# Patient Record
Sex: Female | Born: 1975
Health system: Southern US, Community
[De-identification: ages and names within clinical notes are randomized; demographics above are authoritative.]

## PROBLEM LIST (undated history)

## (undated) DIAGNOSIS — E039 Hypothyroidism, unspecified: Secondary | ICD-10-CM

## (undated) DIAGNOSIS — E119 Type 2 diabetes mellitus without complications: Secondary | ICD-10-CM

## (undated) DIAGNOSIS — D649 Anemia, unspecified: Secondary | ICD-10-CM

## (undated) HISTORY — DX: Anemia, unspecified: D64.9

## (undated) HISTORY — PX: TUBAL LIGATION: SHX77

## (undated) HISTORY — DX: Hypothyroidism, unspecified: E03.9

---

## 2007-12-07 ENCOUNTER — Ambulatory Visit (HOSPITAL_COMMUNITY): Admission: RE | Admit: 2007-12-07 | Discharge: 2007-12-07 | Payer: Self-pay | Admitting: Obstetrics & Gynecology

## 2008-04-16 ENCOUNTER — Inpatient Hospital Stay (HOSPITAL_COMMUNITY): Admission: AD | Admit: 2008-04-16 | Discharge: 2008-04-19 | Payer: Self-pay | Admitting: Obstetrics & Gynecology

## 2008-04-16 ENCOUNTER — Ambulatory Visit: Payer: Self-pay | Admitting: Obstetrics and Gynecology

## 2009-01-12 DIAGNOSIS — E039 Hypothyroidism, unspecified: Secondary | ICD-10-CM

## 2009-01-12 HISTORY — DX: Hypothyroidism, unspecified: E03.9

## 2010-04-23 LAB — CBC
Hemoglobin: 9.4 g/dL — ABNORMAL LOW (ref 12.0–15.0)
MCHC: 33.7 g/dL (ref 30.0–36.0)
MCHC: 33.8 g/dL (ref 30.0–36.0)
MCHC: 34 g/dL (ref 30.0–36.0)
MCV: 92.7 fL (ref 78.0–100.0)
RBC: 2.94 MIL/uL — ABNORMAL LOW (ref 3.87–5.11)
RDW: 16.1 % — ABNORMAL HIGH (ref 11.5–15.5)
WBC: 14 10*3/uL — ABNORMAL HIGH (ref 4.0–10.5)
WBC: 14.8 10*3/uL — ABNORMAL HIGH (ref 4.0–10.5)

## 2010-04-23 LAB — RPR: RPR Ser Ql: NONREACTIVE

## 2010-05-27 NOTE — Op Note (Signed)
NAME:  Amy Maldonado, Amy Maldonado NO.:  [id number]   MEDICAL RECORD NO.:  [id number]          PATIENT TYPE:  INP   LOCATION:  9126                          [facility]ACILITY:  [facility]   PHYSICIAN:  Allie Bossier, MD        DATE OF BIRTH:  06/27/1975   DATE OF PROCEDURE:  DATE OF DISCHARGE:                               OPERATIVE REPORT   PREOPERATIVE DIAGNOSES:  1. Thirty eight weeks' labor.  2. Previous cesarean section x2.  3. Multiparity, desires sterility.   POSTOPERATIVE DIAGNOSES:  1. Thirty eight weeks' labor.  2. Previous cesarean section x2.  3. Multiparity, desires sterility.   PROCEDURE:  Repeat low-transverse cesarean section.   SURGEON:  Allie Bossier, MD   ANESTHESIA:  Spinal, Pamalee Leyden, MD   COMPLICATIONS:  None.   ESTIMATED BLOOD LOSS:  600 mL.   SPECIMENS:  Cord blood.   FINDINGS:  1. Living female infant with Apgars of 8 and 9 at one and five minutes,      weight 7 pounds 7 ounces.  2. Normal ovaries and oviducts bilaterally.  3. Intact placenta with three-vessel cord.   DETAILED PROCEDURE AND FINDINGS:  Risks, benefits, and alternatives of  the surgery were explained, understood, and accepted, consents were  signed.  She desires a tubal ligation and understands the permanence of  the procedure as well as 0.5% failure rate.  She wishes to proceed.  She  was taken to the operating room and spinal anesthesia was applied  without complication.  She was placed in the dorsal supine position with  a left lateral tilt.  Her abdomen and vagina were prepped and draped in  the usual sterile fashion.  A Foley catheter was placed, which drained  clear urine throughout the case.  Adequate anesthesia was assured.  A  transverse incision was made approximately 2 cm above the symphysis  pubis.  Please note that the patient's previous skin incisions were  vertical and she and I discussed the option of a repeat vertical versus  a primary low-transverse skin incision.  She opted  for the low-  transverse skin incision.  Incision was carried down through the  subcutaneous tissue of the fascia.  Fascia was scored in midline,  fascial incision was extended bilaterally, and curved slightly upwards.  The rectus muscles were partially separated in transverse fashion using  electrosurgical technique.  Excellent hemostasis was maintained.  The  peritoneum was entered with hemostats.  Peritoneal incision was extended  with the Bovie bilaterally and curved slightly upwards taking care to  avoid the bladder.  A bladder blade was placed.  A transverse incision  was made on the moderately well-developed lower uterine segment.  The  uterine incision was extended with bandage scissors bilaterally and  curved slightly upwards.  Amniotomy was performed with hemostat and  clear fluid was noted.  Baby was delivered from vertex presentation with  the assistance of a kiwi vacuum extractor.  Please note the pressure was  in the green zone and only one probe was required to yield an atraumatic  delivery.  The baby's mouth  and nostrils were suctioned prior to  delivery of the shoulders.  Cord was clamped and cut and the baby was  transferred to the pediatrician for routine care.  Weight and Apgars are  listed above.  Cord blood sample was obtained.  The placenta was  extracted manually and noted to be intact.  Please note that the uterus  was left in situ.  The uterine interior was cleaned with a dry lap  sponge.  A bladder blade was placed.  The uterine incision was closed  with one layer of 2-0 Vicryl running locking suture.  Excellent  hemostasis was noted.  I then proceeded to tilt the uterus to each side,  I was able to visualize the ovary and the fimbria of the oviduct in the  isthmic region of each oviduct.  A Filshie clip was placed across the  entire oviduct in a perpendicular fashion.  No bleeding was noted at  either side.  I then re-inspected the uterine incision and no  bleeding  was noted.  The fascia and rectus muscles were hemostatic as well.  The  edges of the peritoneum were grasped with Kelly clamps, and the  peritoneal closure was done with a 2-0 Vicryl running nonlocking suture.  The fascia was closed with a 0-Vicryl running nonlocking suture.  No  defects were palpable.  The subcutaneous tissue was irrigated, cleaned,  and dried.  It was then infiltrated with 30 mL of 0.5% Marcaine.  A  subcuticular closure was done with 3-0 Vicryl suture.  Steri-Strips were  placed across the incision.  Foley catheter drained clear urine  throughout.  She was taken to recovery room in stable condition.  She  tolerated the procedure well.      Allie Bossier, MD  Electronically Signed     MCD/MEDQ  D:  04/16/2008  T:  04/17/2008  Job:  [id number]

## 2010-05-30 NOTE — Discharge Summary (Signed)
NAME:  Amy Maldonado, CZERWONKA NO.:  [id number]   MEDICAL RECORD NO.:  [id number]          PATIENT TYPE:  INP   LOCATION:  9126                          [facility]  [facility]   PHYSICIAN:  Norton Blizzard, MD    DATE OF BIRTH:  10-Feb-1975   DATE OF ADMISSION:  04/16/2008  DATE OF DISCHARGE:  04/19/2008                               DISCHARGE SUMMARY   BRIEF HOSPITAL COURSE:  This is a 35 year old, G3, P3-0-0-3, at 38.4  weeks that presented in labor but desired an elective repeat cesarean  section.  She underwent an uncomplicated repeat low transverse cesarean  section  and bilateral tubal ligation under spinal anesthesia.  Her  estimated blood loss was 600 mL.  She delivered a viable female infant  with Apgar scores of 8 and 9 at 1 at 5 minutes respectively.  The  patient is breastfeeding.  Prenatal labs: blood type was O positive,  rubella immune, RPR nonreactive, HIV nonreactive, hepatitis B surface  antigen negative, gonorrhea and chlamydia negative,  quad screen was  negative, 1-hour Glucola 142.  The patient was instructed to have pelvic  rest for 6 weeks.  She [facility]  [facility].  Her baby did receive a circumcision while here in the hospital.  The baby was discharged with mother, and they were both in stable  condition. Discharge medications are ibuprofen and prenatal vitamins.      Jamie Brookes, MD      Norton Blizzard, MD  Electronically Signed    AS/MEDQ  D:  05/10/2008  T:  05/11/2008  Job:  [id number]

## 2012-05-10 ENCOUNTER — Telehealth (HOSPITAL_COMMUNITY): Payer: Self-pay

## 2012-05-10 ENCOUNTER — Emergency Department (HOSPITAL_COMMUNITY)
Admission: EM | Admit: 2012-05-10 | Discharge: 2012-05-10 | Disposition: A | Discharge status: Home Health | Payer: No Typology Code available for payment source | Source: Home / Self Care

## 2012-05-10 ENCOUNTER — Encounter (HOSPITAL_COMMUNITY): Payer: Self-pay

## 2012-05-10 DIAGNOSIS — O99810 Abnormal glucose complicating pregnancy: Secondary | ICD-10-CM

## 2012-05-10 DIAGNOSIS — D649 Anemia, unspecified: Secondary | ICD-10-CM

## 2012-05-10 DIAGNOSIS — O24419 Gestational diabetes mellitus in pregnancy, unspecified control: Secondary | ICD-10-CM

## 2012-05-10 LAB — GLUCOSE, CAPILLARY: Glucose-Capillary: 80 mg/dL (ref 70–99)

## 2012-05-10 LAB — HEMOGLOBIN A1C
Hgb A1c MFr Bld: 5.3 % (ref <5.7)
Mean Plasma Glucose: 105 mg/dL (ref <117)

## 2012-05-10 LAB — FERRITIN: Ferritin: 4 ng/mL — ABNORMAL LOW (ref 10–291)

## 2012-05-10 LAB — IRON AND TIBC
Iron: 11 ug/dL — ABNORMAL LOW (ref 42–135)
Saturation Ratios: 2 % — ABNORMAL LOW (ref 20–55)
UIBC: 440 ug/dL — ABNORMAL HIGH (ref 125–400)

## 2012-05-10 LAB — VITAMIN B12: Vitamin B-12: 951 pg/mL — ABNORMAL HIGH (ref 211–911)

## 2012-05-10 NOTE — ED Notes (Signed)
Patient has a history of pr- DM Anemis

## 2012-05-10 NOTE — ED Notes (Signed)
Spoke with beth from [facility] lab  Lavender top tube had clotted Need to repeat cbc/rit Tried to call patient- number not working

## 2012-05-10 NOTE — ED Provider Notes (Signed)
History     CSN: [id number]  Arrival date & time 05/10/12  1950  37 year old female who presented to primary care today because of anemia. The history is limited because the patient does not speak [place] although a language interpreter was used. The patient is primarily concerned about gestational diabetes and she developed during her last pregnancy. Apparently she has been monitoring her CBGs and her last sugar this morning was 114. She denies any family history of diabetes. She denies any vaginal bleeding. She denies any hematochezia melena.    Chief Complaint  Patient presents with  . Anemia    (Consider location/radiation/quality/duration/timing/severity/associated sxs/prior treatment) HPI  History reviewed. No pertinent past medical history.  History reviewed. No pertinent past surgical history.  No family history on file.  History  Substance Use Topics  . Smoking status: Not on file  . Smokeless tobacco: Not on file  . Alcohol Use: Not on file    OB History   Grav Para Term Preterm Abortions TAB SAB Ect Mult Living                  Review of Systems Anemia Allergies  Review of patient's allergies indicates no known allergies.  Home Medications  No current outpatient prescriptions on file.  BP 114/66  Pulse 67  Temp(Src) 99 F (37.2 C) (Oral)  Resp 17  SpO2 100%  Physical Exam Neck: Neck supple. No JVD present.  Cardiovascular: Normal rate. Exam reveals no gallop and no friction rub.  No murmur heard.  Pulmonary/Chest: Effort normal and breath sounds normal. She has no decreased breath sounds.  Lymphadenopathy:  She has no cervical adenopathy.  Neurological: She is alert and oriented to person, place, and time. She exhibits normal muscle tone. Coordination normal.  Skin: No rash noted. No erythema.   ED Course  Procedures (including critical care time)  Labs Reviewed - No data to display No results found.   No diagnosis found.    MDM   Anemia Will redraw anemia panel, CBC,  History of gestational diabetes next number check a hemoglobin A1c        Richarda Overlie, MD 05/10/12 1119

## 2012-05-24 ENCOUNTER — Ambulatory Visit: Payer: No Typology Code available for payment source | Attending: Family Medicine | Admitting: Internal Medicine

## 2012-05-24 ENCOUNTER — Encounter: Payer: Self-pay | Admitting: Internal Medicine

## 2012-05-24 VITALS — BP 103/69 | HR 70 | Temp 98.6°F | Resp 17 | Wt 128.0 lb

## 2012-05-24 DIAGNOSIS — E039 Hypothyroidism, unspecified: Secondary | ICD-10-CM | POA: Insufficient documentation

## 2012-05-24 DIAGNOSIS — D649 Anemia, unspecified: Secondary | ICD-10-CM | POA: Insufficient documentation

## 2012-05-24 DIAGNOSIS — D509 Iron deficiency anemia, unspecified: Secondary | ICD-10-CM | POA: Insufficient documentation

## 2012-05-24 MED ORDER — FERROUS SULFATE 325 (65 FE) MG PO TABS: 325.0000 mg | ORAL_TABLET | Freq: Two times a day (BID) | ORAL | Status: DC

## 2012-05-24 MED ORDER — LEVOTHYROXINE SODIUM 25 MCG PO TABS: 25.0000 ug | ORAL_TABLET | Freq: Every day | ORAL | Status: DC

## 2012-05-24 NOTE — Patient Instructions (Addendum)
Dficit de hierro Anemia (Iron Deficiency Anemia) Existen diferentes tipos de anemia. La anemia por dficit de hierro es la ms comn. La anemia por deficiencia de hierro es una disminucin en la cantidad de glbulos rojos producida por una baja cantidad de hierro. Sin la suficiente cantidad de hierro, su cuerpo no puede producir suficiente hemoglobina. La hemoglobina es una sustancia presente en los glbulos rojos que lleva oxgeno a los tejidos del cuerpo. La anemia por deficiencia de hierro puede producirle cansancio y falta de aire. CAUSAS  Falta de hierro en la dieta.  Esto puede observarse en bebs y nios, debido a la poca cantidad de Dance movement psychotherapist.  Puede observarse en adultos que no consumen alimentos ricos en hierro.  Puede observarse en mujeres embarazadas o que amamantan, que no toman suplementos de hierro. Hay una mayor necesidad de consumo de hierro en estos momentos.  Pobre absorcin de hierro, como ocurre [employer] trastornos intestinales, como la remocin [place] del intestino delgado o en un bypass intestinal.  Sangrado intestinal.  Perodos menstruales profusos. SNTOMAS Cuando la anemia es leve puede pasar inadvertida. Los sntomas pueden ser:  Alma Friendly.  Dolor de [place].  Palidez.  Debilidad.  Falta de aire.  Mareos.  Manos y pies fros.  Latidos cardacos irregulares o acelerados. DIAGNSTICO El diagnstico requiere una evaluacin rigurosa y un examen fsico por parte del profesional que lo asiste.  Los exmenes sanguneos se utilizan generalmente para confirmar una anemia por dficit de hierro.  Podrn realizarse pruebas adicionales para encontrar la causa subyacente de su anemia. Pueden incluir:  El anlisis de sangre en la materia fecal (examen de sangre oculta en heces).  Un procedimiento para observar dentro del colon y el recto (colonoscopa).  Un procedimiento para observar dentro del esfago y Investment banker, corporate  (endoscopa). TRATAMIENTO  Corregir la causa del dficit de hierro es Secretary/administrator.  Ciertos medicamentos, como los anticonceptivos orales, pueden hacer que el flujo menstrual sea ms leve.  Pueden utilizarse antibiticos y otros medicamentos para tratar lceras ppticas.  Puede ser Lois Huxley ciruga para remover un plipo que sangra, un tumor o un fibroide.  A menudo, se deben tomar suplementos de hierro (sulfato ferroso).  Para una mejor absorcin del hierro, tome estos suplementos con el estmago vaco.  Es posible que deba tomar los suplementos con alimentos si no puede tolerarlos con el estmago vaco. La vitamina C mejora la absorcin de hierro. El profesional que lo asiste podr recomendarle que tome sus tabletas de hierro con un vaso de jugo de naranja o suplemento de vitamina C.  No debe tomar [employer] o anticidos al mismo tiempo que los suplementos de hierro. Esto podra interferir con la absorcin de hierro.  Los suplementos de hierro pueden causar constipacin. A menudo se recomienda un ablandador de heces.  Las mujeres embarazadas o que amamantan debern tomar suplementos de hierro, porque su dieta normal a menudo no provee la cantidad necesaria.  Los pacientes que no pueden Engineer, technical sales hierro [employer] lo pueden recibir por Paramedic, o por medio de una inyeccin en el msculo. INSTRUCCIONES PARA EL CUIDADO DOMICILIARIO  El dietista o el profesional que lo asiste podrn ayudarla con las dudas relacionadas con la dieta.  Tome hierro y vitaminas segn se lo ha Designer, television/film set que lo asiste.  Lleve una dieta rica en hierro. Consuma hgado, carne magra, pan integral, huevos, frutos secos, y vegetales de [employer]. SOLICITE ATENCIN MDICA DE INMEDIATO SI:  Tiene un episodio  de desmayo. No conduzca.Llame (911 en los [place]) o al servicio de emergencias local si no dispone de otro tipo de [place].  Siente dolor en el pecho, nuseas (ganas de  vomitar) o vmitos.  Presenta una dificultad respiratoria grave al Arts development officer.  Se siente dbil o con mucha sed.  Tiene pulso y ritmo cardaco acelerados.  Presenta una sudoracin imprevista o se siente mareado al levantarse de la cama o de la silla. ASEGRESE QUE:  Comprende estas instrucciones.  Controlar su enfermedad.  Solicitar ayuda inmediatamente si no mejora o si empeora. Document Released: 12/29/2004 Document Revised: 03/23/2011 [facility] Patient Information 2013 [place], [place].

## 2012-05-24 NOTE — Progress Notes (Signed)
Patient is a hospital follow up Diagnosis- anemia

## 2012-05-24 NOTE — Progress Notes (Signed)
Patient ID: Amy Maldonado, female   DOB: 1975-09-06, 37 y.o.   MRN: [id number]   CC: follow up on recent blood work   HPI: Pt is 37 yo female with history of anemia and hypothyroidism, who presents to clinic for follow up on recent blood work, wants to know if she is diabetic. She denies chest pain or shortness of breath, no abdominal or urinary concerns, no recent sicknesses or hospitalizations.  No Known Allergies  MEDICATIONS: ferrous sulfate 325 (65 FE) MG tablet, Take 1 tablet (325 mg total) by mouth 2 (two) times daily with breakfast and lunch levothyroxine (SYNTHROID, LEVOTHROID) 25 MCG tablet, Take 1 tablet (25 mcg total) by mouth daily before breakfast  Past Medical History  Diagnosis Date  . Anemia   . Hypothyroidism 2011    Family History  Problem Relation Age of Onset  . Hypertension Mother   . Diabetes Father   . Hypertension Father     History   Social History  . Marital Status: Married    Spouse Name: N/A    Number of Children: N/A  . Years of Education: N/A   Occupational History  . Not on file.   Social History Main Topics  . Smoking status: Never Smoker   . Smokeless tobacco: Not on file  . Alcohol Use: No  . Drug Use: No  . Sexually Active: Yes   Other Topics Concern  . Not on file   Social History Narrative  . No narrative on file    Review of Systems  Constitutional: Negative for fever, chills, diaphoresis, activity change, appetite change and fatigue.  HENT: Negative for ear pain, nosebleeds, congestion, facial swelling, rhinorrhea, neck pain, neck stiffness and ear discharge.   Eyes: Negative for pain, discharge, redness, itching and visual disturbance.  Respiratory: Negative for cough, choking, chest tightness, shortness of breath, wheezing and stridor.   Cardiovascular: Negative for chest pain, palpitations and leg swelling.  Gastrointestinal: Negative for abdominal distention.  Genitourinary: Negative for dysuria, urgency,  frequency, hematuria, flank pain, decreased urine volume, difficulty urinating and dyspareunia.  Musculoskeletal: Negative for back pain, joint swelling, arthralgias and gait problem.  Neurological: Negative for dizziness, tremors, seizures, syncope, facial asymmetry, speech difficulty, weakness, light-headedness, numbness and headaches.  Hematological: Negative for adenopathy. Does not bruise/bleed easily.  Psychiatric/Behavioral: Negative for hallucinations, behavioral problems, confusion, dysphoric mood, decreased concentration and agitation.    Objective:   Filed Vitals:   05/24/12 1222  BP: 103/69  Pulse: 70  Temp: 98.6 F (37 C)  Resp: 17    Physical Exam  Constitutional: Appears well-developed and well-nourished. No distress.  HENT: Normocephalic. External right and left ear normal. Oropharynx is clear and moist.  Eyes: Conjunctivae and EOM are normal. PERRLA, no scleral icterus.  Neck: Normal ROM. Neck supple. No JVD. No tracheal deviation. No thyromegaly.  CVS: RRR, S1/S2 +, no murmurs, no gallops, no carotid bruit.  Pulmonary: Effort and breath sounds normal, no stridor, rhonchi, wheezes, rales.  Abdominal: Soft. BS +,  no distension, tenderness, rebound or guarding.  Musculoskeletal: Normal range of motion. No edema and no tenderness.  Lymphadenopathy: No lymphadenopathy noted, cervical, inguinal. Neuro: Alert. Normal reflexes, muscle tone coordination. No cranial nerve deficit. Skin: Skin is warm and dry. No rash noted. Not diaphoretic. No erythema. No pallor.  Psychiatric: Normal mood and affect. Behavior, judgment, thought content normal.   Lab Results  Component Value Date   WBC 14.8* 04/18/2008   HGB 9.4* 04/18/2008   HCT 27.8* 04/18/2008  MCV 94.6 04/18/2008   PLT 155 04/18/2008   No results found for this basename: CREATININE, BUN, NA, K, CL, CO2    Lab Results  Component Value Date   HGBA1C 5.3 05/10/2012    Assessment:   Patient Active Problem List    Diagnosis Date Noted  . Iron deficiency anemia 05/24/2012  . Hypothyroidism         Plan:   - continue taking iron supplementation BID, repeat CBC in 3 months - continue Synthroid 25 mcg QD, check TSH in 3 months - discussed regular follow ups and dietary recommendation provided as pt has family history of diabetes but per A1C she isn ot currently considered diabetic

## 2012-09-06 ENCOUNTER — Ambulatory Visit: Payer: No Typology Code available for payment source | Attending: Internal Medicine | Admitting: Internal Medicine

## 2012-09-06 ENCOUNTER — Other Ambulatory Visit (HOSPITAL_COMMUNITY)
Admission: RE | Admit: 2012-09-06 | Discharge: 2012-09-06 | Disposition: A | Discharge status: Home / Self Care | Payer: No Typology Code available for payment source | Source: Ambulatory Visit | Attending: Internal Medicine | Admitting: Internal Medicine

## 2012-09-06 VITALS — BP 108/73 | HR 92 | Temp 98.9°F | Resp 16 | Wt 118.0 lb

## 2012-09-06 DIAGNOSIS — N760 Acute vaginitis: Secondary | ICD-10-CM | POA: Insufficient documentation

## 2012-09-06 DIAGNOSIS — E039 Hypothyroidism, unspecified: Secondary | ICD-10-CM

## 2012-09-06 DIAGNOSIS — K644 Residual hemorrhoidal skin tags: Secondary | ICD-10-CM | POA: Insufficient documentation

## 2012-09-06 DIAGNOSIS — Z113 Encounter for screening for infections with a predominantly sexual mode of transmission: Secondary | ICD-10-CM | POA: Insufficient documentation

## 2012-09-06 DIAGNOSIS — D509 Iron deficiency anemia, unspecified: Secondary | ICD-10-CM

## 2012-09-06 DIAGNOSIS — Z01419 Encounter for gynecological examination (general) (routine) without abnormal findings: Secondary | ICD-10-CM | POA: Insufficient documentation

## 2012-09-06 LAB — CBC WITH DIFFERENTIAL/PLATELET
Basophils Absolute: 0 10*3/uL (ref 0.0–0.1)
Eosinophils Absolute: 0 10*3/uL (ref 0.0–0.7)
HCT: 37.3 % (ref 36.0–46.0)
MCV: 93.5 fL (ref 78.0–100.0)
Monocytes Relative: 6 % (ref 3–12)
Neutrophils Relative %: 77 % (ref 43–77)
Platelets: 343 10*3/uL (ref 150–400)
RBC: 3.99 MIL/uL (ref 3.87–5.11)
RDW: 14.5 % (ref 11.5–15.5)

## 2012-09-06 LAB — COMPLETE METABOLIC PANEL WITH GFR
ALT: 23 U/L (ref 0–35)
Alkaline Phosphatase: 53 U/L (ref 39–117)
BUN: 10 mg/dL (ref 6–23)
Calcium: 9.4 mg/dL (ref 8.4–10.5)
Chloride: 104 mEq/L (ref 96–112)
Glucose, Bld: 82 mg/dL (ref 70–99)
Potassium: 4 mEq/L (ref 3.5–5.3)

## 2012-09-06 LAB — LIPID PANEL
Cholesterol: 148 mg/dL (ref 0–200)
VLDL: 13 mg/dL (ref 0–40)

## 2012-09-06 MED ORDER — HYDROCORTISONE 2.5 % RE CREA: TOPICAL_CREAM | Freq: Two times a day (BID) | RECTAL | Status: DC

## 2012-09-06 NOTE — Patient Instructions (Signed)
Hemorroides  (Hemorrhoids) Las hemorroides son venas inflamadas alrededor del recto o del ano. Hay dos tipos de hemorroides:   Hemorroides internas. Aparecen en las venas del interior del recto. Pueden abultarse hacia el exterior e irritarse y Cabin crew.  Hemorroides externas. Se producen en las venas externas al ano y pueden sentirse como un bulto o zona hinchada dura y dolorosa cerca del ano. CAUSAS   Embarazo.   Obesidad.   Constipacin o diarrea.   Dificultad para mover el intestino.   Permanecer sentado durante largos perodos en el inodoro.  Levantar pesas u otras actividades que impliquen esfuerzo.  Sexo anal. SNTOMAS   Dolor.   Picazn o irritacin anal.   Sangrado rectal.   Prdida fecal.   Hinchazn anal.   Uno o ms bultos en la zona anal.  DIAGNSTICO  El mdico puede diagnosticar las hemorroides mediante un examen visual. Otros estudios o anlisis que se pueden realizar son:   Examen de la zona rectal con Neomia Dear mano enguantada (examen digital rectal).   Examen de canal anal usando un pequeo tubo (endoscopio).   Anlisis de sangre si ha perdido [place] cantidad significativa de [place].  Estudio para observar el interior del colon (sigmoidoscopa o colonoscopa). TRATAMIENTO  La mayora de las hemorroides pueden tratarse en casa. Sin embargo, si los sntomas no mejoran o tiene Runner, broadcasting/film/video, el mdico puede Education officer, environmental un procedimiento para disminuir las hemorroides o extirparlas completamente. Los tratamientos posibles son:   Colocacin de una banda de goma en la base de la hemorroide para cortar la circulacin (ligadura con Curator).   Inyeccin de una sustancia qumica para reducir la hemorroide (escleroterapia).   Utilizacin de un instrumento para quemar la hemorroide (terapia con luz infrarroja).   Extirpacin quirrgica de las hemorroides (hemorroidectoma).   Colocacin de grapas en la hemorroide para bloquear el flujo de sangre  a los tejidos (engrapado de hemorroides).  INSTRUCCIONES PARA EL CUIDADO EN EL HOGAR   Consuma alimentos con fibra, como cereales integrales, legumbres, frutos secos, frutas y verduras. Pregntele a su mdico acerca de tomar productos con fibra aadida en ellos (suplementos defibra).  Aumente la ingesta de lquidos. Beba gran cantidad de lquido para mantener la orina de tono claro o color amarillo plido.   Haga ejercicios regularmente.   Vaya al bao cuando sienta la necesidad de mover el intestino. No espere.   Evite hacer fuerza al mover el intestino.   Mantenga la zona anal limpia y seca. Use papel higinico hmedo o toallitas humedecidas despus de mover el intestino.   Puede usar o Contractor segn las indicaciones algunas cremas especiales y supositorios.   Tome slo medicamentos de venta libre o recetados, segn las indicaciones del mdico.   Tome baos de asiento tibios durante 15 a 20 minutos, 3 a 4 veces por da para Primary school teacher y las [place].   Coloque una bolsa de hielo sobre las hemorroides si le duelen o se hinchan. Usar las compresas de [employer] baos de asiento puede ser [place].   Ponga el hielo en una bolsa plstica.   Colquese una toalla entre la piel y la bolsa de hielo.   Deje el hielo durante 15 a 20 minutos y aplquelo 3 a 4 veces por da.   No utilice una almohada en forma de aro ni se siente en el inodoro durante perodos prolongados. Esto aumenta la afluencia de sangre y Chief Technology Officer.  SOLICITE ATENCIN MDICA SI:   Aumenta el dolor y la hinchazn  y no puede controlarlo con la medicacin o con un tratamiento.  Tiene un sangrado que no puede parar.  No puede mover el intestino.  Siente dolor o tiene inflamacin fuera de la zona de las hemorroides. ASEGRESE DE QUE:   Comprende estas instrucciones.  Controlar su enfermedad.  Solicitar ayuda de inmediato si no mejora o si empeora. Document Released: 12/29/2004 Document  Revised: 12/16/2011 [facility] Patient Information 2014 [place], [place].

## 2012-09-06 NOTE — Progress Notes (Signed)
Patient here for physical and pap Has thyroid issues  Anemia Had some type of pimple removed from her rectum Has burning when she has bowel movement

## 2012-09-06 NOTE — Progress Notes (Signed)
Patient ID: Amy Maldonado, female   DOB: 03/17/1975, 37 y.o.   MRN: [id number]  CC: Followup  HPI: Patient is a 37 years old woman here today for followup visit. And also for Pap smear. He has history of external hemorrhoid status post electrocautery. She continues to experience pain during defecation, occasional blood in stool. Patient claims to engage in an anal sex with husband and wonders if this could be the result. Denies chest pain, no headache.  No Known Allergies Past Medical History  Diagnosis Date  . Anemia   . Hypothyroidism 2011   Current Outpatient Prescriptions on File Prior to Visit  Medication Sig Dispense Refill  . ferrous sulfate 325 (65 FE) MG tablet Take 1 tablet (325 mg total) by mouth 2 (two) times daily with breakfast and lunch.  60 tablet  3  . levothyroxine (SYNTHROID, LEVOTHROID) 25 MCG tablet Take 1 tablet (25 mcg total) by mouth daily before breakfast.  30 tablet  3   No current facility-administered medications on file prior to visit.   Family History  Problem Relation Age of Onset  . Hypertension Mother   . Diabetes Father   . Hypertension Father    History   Social History  . Marital Status: Married    Spouse Name: N/A    Number of Children: N/A  . Years of Education: N/A   Occupational History  . Not on file.   Social History Main Topics  . Smoking status: Never Smoker   . Smokeless tobacco: Not on file  . Alcohol Use: No  . Drug Use: No  . Sexual Activity: Yes   Other Topics Concern  . Not on file   Social History Narrative  . No narrative on file    Review of Systems: Constitutional: Negative for fever, chills, diaphoresis, activity change, appetite change and fatigue. HENT: Negative for ear pain, nosebleeds, congestion, facial swelling, rhinorrhea, neck pain, neck stiffness and ear discharge.  Eyes: Negative for pain, discharge, redness, itching and visual disturbance. Respiratory: Negative for cough, choking, chest tightness,  shortness of breath, wheezing and stridor.  Cardiovascular: Negative for chest pain, palpitations and leg swelling. Gastrointestinal: Negative for abdominal distention. Genitourinary: Negative for dysuria, urgency, frequency, hematuria, flank pain, decreased urine volume, difficulty urinating and dyspareunia.  Musculoskeletal: Negative for back pain, joint swelling, arthralgias and gait problem. Neurological: Negative for dizziness, tremors, seizures, syncope, facial asymmetry, speech difficulty, weakness, light-headedness, numbness and headaches.  Hematological: Negative for adenopathy. Does not bruise/bleed easily. Psychiatric/Behavioral: Negative for hallucinations, behavioral problems, confusion, dysphoric mood, decreased concentration and agitation.    Objective:   Filed Vitals:   09/06/12 1009  BP: 108/73  Pulse: 92  Temp: 98.9 F (37.2 C)  Resp: 16    Physical Exam: Constitutional: Patient appears well-developed and well-nourished. No distress. HENT: Normocephalic, atraumatic, External right and left ear normal. Oropharynx is clear and moist.  Eyes: Conjunctivae and EOM are normal. PERRLA, no scleral icterus. Neck: Normal ROM. Neck supple. No JVD. No tracheal deviation. No thyromegaly. CVS: RRR, S1/S2 +, no murmurs, no gallops, no carotid bruit.  Pulmonary: Effort and breath sounds normal, no stridor, rhonchi, wheezes, rales.  Abdominal: Soft. BS +,  no distension, tenderness, rebound or guarding.  Musculoskeletal: Normal range of motion. No edema and no tenderness.  Lymphadenopathy: No lymphadenopathy noted, cervical, inguinal or axillary Neuro: Alert. Normal reflexes, muscle tone coordination. No cranial nerve deficit. Skin: Skin is warm and dry. No rash noted. Not diaphoretic. No erythema. No pallor. Psychiatric: Normal  mood and affect. Behavior, judgment, thought content normal. Rectal exam: Circumferential anal tag, no active bleeding, no signs of inflammation Lab  Results  Component Value Date   WBC 14.8* 04/18/2008   HGB 9.4* 04/18/2008   HCT 27.8* 04/18/2008   MCV 94.6 04/18/2008   PLT 155 04/18/2008   No results found for this basename: CREATININE, BUN, NA, K, CL, CO2    Lab Results  Component Value Date   HGBA1C 5.3 05/10/2012   Lipid Panel  No results found for this basename: chol, trig, hdl, cholhdl, vldl, ldlcalc       Assessment and plan:   Patient Active Problem List   Diagnosis Date Noted  . External hemorrhoid 09/06/2012  . Iron deficiency anemia 05/24/2012  . Unspecified hypothyroidism 05/24/2012  . Hypothyroidism   Pelvic exam: Normal female external genitalia. Minimal whitish discharge. Cervix motion tenderness negative. Pap smear done  CBC D CMP Lipid panel Complete urinalysis Thyroid function test Hemoglobin A1c  Pap smear done, for cytology GC/Chlamydia and wet mount  Anusol cream for external hemorrhoid  Saida Lonon was given clear instructions to go to ER or return to the clinic if symptoms don't improve, worsen or new problems develop.  Marya Landry verbalized understanding.  Yeira Gulden was told to call to get lab results if hasn't heard anything in the next week.          Jeanann Lewandowsky, MD [facility] [place], [place] [phone]   09/06/2012, 11:04 AM

## 2012-09-07 LAB — URINALYSIS, COMPLETE
Glucose, UA: NEGATIVE mg/dL
Hgb urine dipstick: NEGATIVE
Urobilinogen, UA: 0.2 mg/dL (ref 0.0–1.0)
pH: 7 (ref 5.0–8.0)

## 2012-09-08 ENCOUNTER — Telehealth: Payer: Self-pay

## 2012-09-08 LAB — TSH+FREE T4
Free T4: 1.11 ng/dL (ref 0.82–1.77)
TSH: 3.56 u[IU]/mL (ref 0.450–4.500)

## 2012-09-08 NOTE — Telephone Encounter (Signed)
Done  Left a voice mail to patient about her lab results

## 2012-09-08 NOTE — Telephone Encounter (Signed)
Amy Maldonado can you call patient and let her  Know her pap and blood work was all normal Thank you

## 2012-09-09 ENCOUNTER — Telehealth: Payer: Self-pay | Admitting: Emergency Medicine

## 2012-09-09 DIAGNOSIS — A64 Unspecified sexually transmitted disease: Secondary | ICD-10-CM

## 2012-09-09 DIAGNOSIS — A499 Bacterial infection, unspecified: Secondary | ICD-10-CM

## 2012-09-09 MED ORDER — METRONIDAZOLE 500 MG PO TABS: ORAL_TABLET | ORAL | Status: DC

## 2012-09-09 NOTE — Telephone Encounter (Signed)
Rx Flagyl 500 mg to take BID x 5 dys ordered. Pt may pick up @ [facility].

## 2012-09-09 NOTE — Telephone Encounter (Signed)
Pt informed she can pick Rx Flagyl up from [facility] pharm on [address].

## 2012-09-09 NOTE — Telephone Encounter (Signed)
Pt aware of her results and she would like to take the medicine and send the prescription to [facility]  At [address] .

## 2012-09-09 NOTE — Telephone Encounter (Signed)
Message copied by Darlis Loan on Fri Sep 09, 2012  3:38 PM ------      Message from: Jeanann Lewandowsky E      Created: Thu Sep 08, 2012  9:10 AM       Please call patient to inform her that her Pap smear come back negative for malignancy      She has an infection that is not sexually transmitted but still need to be treated      If she wants we can call in prescription to her desired pharmacy she cannot come to the clinic to pick up a prescription ------

## 2012-09-14 ENCOUNTER — Ambulatory Visit: Payer: No Typology Code available for payment source | Attending: Internal Medicine

## 2012-09-14 ENCOUNTER — Ambulatory Visit: Payer: No Typology Code available for payment source

## 2012-09-19 ENCOUNTER — Other Ambulatory Visit: Payer: Self-pay | Admitting: Internal Medicine

## 2012-09-19 ENCOUNTER — Telehealth: Payer: Self-pay | Admitting: Internal Medicine

## 2012-09-19 MED ORDER — LEVOTHYROXINE SODIUM 25 MCG PO TABS: 25.0000 ug | ORAL_TABLET | Freq: Every day | ORAL | Status: DC

## 2012-09-19 NOTE — Telephone Encounter (Signed)
Pt here for refill on levothyroxine, pt received printed script

## 2013-03-21 ENCOUNTER — Ambulatory Visit: Payer: Self-pay | Attending: Internal Medicine

## 2013-09-19 ENCOUNTER — Telehealth: Payer: Self-pay | Admitting: Internal Medicine

## 2013-09-19 NOTE — Telephone Encounter (Signed)
Pt is requesting a refill for her thyroid: levothyroxine (SYNTHROID, LEVOTHROID) 25 MCG tablet. She only has 10 pills left. Please follow up with her. She is a Paediatric nurse.

## 2013-09-20 NOTE — Telephone Encounter (Signed)
Pt have not been seen in 1 year. Pt stated will make apt with PCP for Thyroid F/U and medicine refill.

## 2013-09-26 ENCOUNTER — Other Ambulatory Visit: Payer: Self-pay

## 2013-09-28 ENCOUNTER — Ambulatory Visit: Payer: Self-pay | Attending: Internal Medicine | Admitting: Internal Medicine

## 2013-09-28 ENCOUNTER — Encounter: Payer: Self-pay | Admitting: Internal Medicine

## 2013-09-28 VITALS — BP 115/75 | HR 81 | Temp 99.2°F | Resp 16 | Wt 120.0 lb

## 2013-09-28 DIAGNOSIS — D649 Anemia, unspecified: Secondary | ICD-10-CM | POA: Insufficient documentation

## 2013-09-28 DIAGNOSIS — E039 Hypothyroidism, unspecified: Secondary | ICD-10-CM | POA: Insufficient documentation

## 2013-09-28 LAB — POCT GLYCOSYLATED HEMOGLOBIN (HGB A1C): Hemoglobin A1C: 5.2

## 2013-09-28 MED ORDER — LEVOTHYROXINE SODIUM 25 MCG PO TABS: 25.0000 ug | ORAL_TABLET | Freq: Every day | ORAL | Status: DC

## 2013-09-28 MED ORDER — FERROUS SULFATE 325 (65 FE) MG PO TABS: 325.0000 mg | ORAL_TABLET | Freq: Two times a day (BID) | ORAL | Status: DC

## 2013-09-28 NOTE — Progress Notes (Signed)
Patient ID: Amy Maldonado, female   DOB: 02/16/1975, 38 y.o.   MRN: [id number]   Amy Maldonado, is a 38 y.o. female  [id number]  [id number]  DOB - [birth year]  Chief Complaint  Patient presents with  . Follow-up        Subjective:   Amy Maldonado is a 38 y.o. female here today for a follow up visit. Patient history significant for hypothyroidism and chronic anemia here today for routine follow-up. She has no new complaint. She is here for blood draw. Patient is on levothyroxine 25 g tablet by mouth daily and iron supplement. Patient has No headache, No chest pain, No abdominal pain - No Nausea, No new weakness tingling or numbness, No Cough - SOB.  No problems updated.  ALLERGIES: No Known Allergies  PAST MEDICAL HISTORY: Past Medical History  Diagnosis Date  . Anemia   . Hypothyroidism 2011    MEDICATIONS AT HOME: Prior to Admission medications   Medication Sig Start Date End Date Taking? Authorizing Provider  levothyroxine (SYNTHROID, LEVOTHROID) 25 MCG tablet Take 1 tablet (25 mcg total) by mouth daily before breakfast. 09/28/13  Yes Tresa Garter, MD  ferrous sulfate 325 (65 FE) MG tablet Take 1 tablet (325 mg total) by mouth 2 (two) times daily with breakfast and lunch. 09/28/13   Tresa Garter, MD  hydrocortisone (ANUSOL-HC) 2.5 % rectal cream Place rectally 2 (two) times daily. 09/06/12   Tresa Garter, MD  metroNIDAZOLE (FLAGYL) 500 MG tablet Take for 5 days 09/09/12   Tresa Garter, MD     Objective:   Filed Vitals:   09/28/13 1554  BP: 115/75  Pulse: 81  Temp: 99.2 F (37.3 C)  TempSrc: Oral  Resp: 16  Weight: 120 lb (54.432 kg)  SpO2: 93%    Exam General appearance : Awake, alert, not in any distress. Speech Clear. Not toxic looking HEENT: Atraumatic and Normocephalic, pupils equally reactive to light and accomodation Neck: supple, no JVD. No cervical lymphadenopathy.  Chest:Good air entry  bilaterally, no added sounds  CVS: S1 S2 regular, no murmurs.  Abdomen: Bowel sounds present, Non tender and not distended with no gaurding, rigidity or rebound. Extremities: B/L Lower Ext shows no edema, both legs are warm to touch Neurology: Awake alert, and oriented X 3, CN II-XII intact, Non focal Skin:No Rash Wounds:N/A  Data Review Lab Results  Component Value Date   HGBA1C 5.3 05/10/2012     Assessment & Plan   1. Unspecified hypothyroidism  - TSH - T4, Free - T3, Free  - levothyroxine (SYNTHROID, LEVOTHROID) 25 MCG tablet; Take 1 tablet (25 mcg total) by mouth daily before breakfast.  Dispense: 90 tablet; Refill: 3  - ferrous sulfate 325 (65 FE) MG tablet; Take 1 tablet (325 mg total) by mouth 2 (two) times daily with breakfast and lunch.  Dispense: 180 tablet; Refill: 3  - POCT glycosylated hemoglobin (Hb A1C)  Patient was counseled extensively about nutrition and exercise.  Interpreter was used to communicate directly with patient for the entire encounter including providing detailed patient instructions.   Return in about 6 months (around 03/29/2014), or if symptoms worsen or fail to improve, for Hypothyroidism.  The patient was given clear instructions to go to ER or return to medical center if symptoms don't improve, worsen or new problems develop. The patient verbalized understanding. The patient was told to call to get lab results if they haven't heard anything in the next week.  This note has been created with Surveyor, quantity. Any transcriptional errors are unintentional.    Angelica Chessman, MD, [place], [place], [place] and [place], [place]   09/28/2013, 4:42 PM

## 2013-09-28 NOTE — Patient Instructions (Signed)
Hypothyroidism The thyroid is a large gland located in the lower front of your neck. The thyroid gland helps control metabolism. Metabolism is how your body handles food. It controls metabolism with the hormone thyroxine. When this gland is underactive (hypothyroid), it produces too little hormone.  CAUSES These include:   Absence or destruction of thyroid tissue.  Goiter due to iodine deficiency.  Goiter due to medications.  Congenital defects (since birth).  Problems with the pituitary. This causes a lack of TSH (thyroid stimulating hormone). This hormone tells the thyroid to turn out more hormone. SYMPTOMS  Lethargy (feeling as though you have no energy)  Cold intolerance  Weight gain (in spite of normal food intake)  Dry skin  Coarse hair  Menstrual irregularity (if severe, may lead to infertility)  Slowing of thought processes Cardiac problems are also caused by insufficient amounts of thyroid hormone. Hypothyroidism in the newborn is cretinism, and is an extreme form. It is important that this form be treated adequately and immediately or it will lead rapidly to retarded physical and mental development. DIAGNOSIS  To prove hypothyroidism, your caregiver may do blood tests and ultrasound tests. Sometimes the signs are hidden. It may be necessary for your caregiver to watch this illness with blood tests either before or after diagnosis and treatment. TREATMENT  Low levels of thyroid hormone are increased by using synthetic thyroid hormone. This is a safe, effective treatment. It usually takes about four weeks to gain the full effects of the medication. After you have the full effect of the medication, it will generally take another four weeks for problems to leave. Your caregiver may start you on low doses. If you have had heart problems the dose may be gradually increased. It is generally not an emergency to get rapidly to normal. HOME CARE INSTRUCTIONS   Take your  medications as your caregiver suggests. Let your caregiver know of any medications you are taking or start taking. Your caregiver will help you with dosage schedules.  As your condition improves, your dosage needs may increase. It will be necessary to have continuing blood tests as suggested by your caregiver.  Report all suspected medication side effects to your caregiver. SEEK MEDICAL CARE IF: Seek medical care if you develop:  Sweating.  Tremulousness (tremors).  Anxiety.  Rapid weight loss.  Heat intolerance.  Emotional swings.  Diarrhea.  Weakness. SEEK IMMEDIATE MEDICAL CARE IF:  You develop chest pain, an irregular heart beat (palpitations), or a rapid heart beat. MAKE SURE YOU:   Understand these instructions.  Will watch your condition.  Will get help right away if you are not doing well or get worse. Document Released: 12/29/2004 Document Revised: 03/23/2011 Document Reviewed: 08/19/2007 ExitCare Patient Information 2015 [employer]. This information is not intended to replace advice given to you by your health care provider. Make sure you discuss any questions you have with your health care provider.  

## 2013-09-28 NOTE — Progress Notes (Signed)
Pt is here following up on her hypothyroidism.

## 2013-09-29 LAB — T3, FREE: T3, Free: 2.8 pg/mL (ref 2.3–4.2)

## 2013-09-29 LAB — T4, FREE: FREE T4: 0.96 ng/dL (ref 0.80–1.80)

## 2013-09-29 LAB — TSH: TSH: 3.525 u[IU]/mL (ref 0.350–4.500)

## 2013-10-04 ENCOUNTER — Telehealth: Payer: Self-pay | Admitting: Emergency Medicine

## 2013-10-04 NOTE — Telephone Encounter (Signed)
Pt given normal blood work results per Administrator, sports

## 2013-10-18 ENCOUNTER — Ambulatory Visit: Payer: Self-pay

## 2013-10-19 ENCOUNTER — Telehealth: Payer: Self-pay | Admitting: Internal Medicine

## 2013-10-19 ENCOUNTER — Ambulatory Visit: Payer: Self-pay | Attending: Internal Medicine | Admitting: Internal Medicine

## 2013-10-19 ENCOUNTER — Encounter: Payer: Self-pay | Admitting: Internal Medicine

## 2013-10-19 VITALS — BP 105/72 | HR 69 | Temp 97.6°F | Resp 16 | Ht 59.45 in | Wt 116.0 lb

## 2013-10-19 DIAGNOSIS — A64 Unspecified sexually transmitted disease: Secondary | ICD-10-CM

## 2013-10-19 DIAGNOSIS — E039 Hypothyroidism, unspecified: Secondary | ICD-10-CM | POA: Insufficient documentation

## 2013-10-19 DIAGNOSIS — Z01419 Encounter for gynecological examination (general) (routine) without abnormal findings: Secondary | ICD-10-CM | POA: Insufficient documentation

## 2013-10-19 DIAGNOSIS — Z124 Encounter for screening for malignant neoplasm of cervix: Secondary | ICD-10-CM

## 2013-10-19 DIAGNOSIS — D649 Anemia, unspecified: Secondary | ICD-10-CM | POA: Insufficient documentation

## 2013-10-19 NOTE — Progress Notes (Signed)
Patient ID: Amy Maldonado, female   DOB: 1975/10/07, 38 y.o.   MRN: [id number]   Amy Maldonado, is a 38 y.o. female  [id number]  [id number]  DOB - 02-Dec-1975  Chief Complaint  Patient presents with  . Follow-up        Subjective:   Amy Maldonado is a 38 y.o. female here today for a follow up visit. Patient with history of hypothyroidism and chronic anemia here today for annual physical examination and Pap smear. Patient has no complaint today. Patient has No headache, No chest pain, No abdominal pain - No Nausea, No new weakness tingling or numbness, No Cough - SOB. Patient is sexually active.  Problem  Pap Smear for Cervical Cancer Screening  Sexually Transmitted Disease (Std)    ALLERGIES: No Known Allergies  PAST MEDICAL HISTORY: Past Medical History  Diagnosis Date  . Anemia   . Hypothyroidism 2011    MEDICATIONS AT HOME: Prior to Admission medications   Medication Sig Start Date End Date Taking? Authorizing Provider  levothyroxine (SYNTHROID, LEVOTHROID) 25 MCG tablet Take 1 tablet (25 mcg total) by mouth daily before breakfast. 09/28/13  Yes Tresa Garter, MD  ferrous sulfate 325 (65 FE) MG tablet Take 1 tablet (325 mg total) by mouth 2 (two) times daily with breakfast and lunch. 09/28/13   Tresa Garter, MD  hydrocortisone (ANUSOL-HC) 2.5 % rectal cream Place rectally 2 (two) times daily. 09/06/12   Tresa Garter, MD  metroNIDAZOLE (FLAGYL) 500 MG tablet Take for 5 days 09/09/12   Tresa Garter, MD     Objective:   Filed Vitals:   10/19/13 1140  BP: 105/72  Pulse: 69  Temp: 97.6 F (36.4 C)  TempSrc: Oral  Resp: 16  Height: 4' 11.45" (1.51 m)  Weight: 116 lb (52.617 kg)  SpO2: 100%    Exam General appearance : Awake, alert, not in any distress. Speech Clear. Not toxic looking HEENT: Atraumatic and Normocephalic, pupils equally reactive to light and accomodation Neck: supple, no JVD. No cervical  lymphadenopathy.  Chest:Good air entry bilaterally, no added sounds  CVS: S1 S2 regular, no murmurs.  Abdomen: Bowel sounds present, Non tender and not distended with no gaurding, rigidity or rebound. Extremities: B/L Lower Ext shows no edema, both legs are warm to touch Neurology: Awake alert, and oriented X 3, CN II-XII intact, Non focal Pelvic Exam: Cervix normal in appearance, external genitalia normal, no adnexal masses or tenderness, no cervical motion tenderness, rectovaginal septum normal, uterus normal size, shape, and consistency and vagina normal with minimal discharge   Data Review Lab Results  Component Value Date   HGBA1C 5.2 09/28/2013   HGBA1C 5.3 05/10/2012     Assessment & Plan   1. Pap smear for cervical cancer screening  - Cytology - PAP - Cervicovaginal ancillary only  2. Sexually transmitted disease (STD)  - Cytology - PAP - Cervicovaginal ancillary only  Interpreter was used to communicate directly with patient for the entire encounter including providing detailed patient instructions.   Return in about 6 months (around 04/20/2014), or if symptoms worsen or fail to improve, for Hypothyroidism.  The patient was given clear instructions to go to ER or return to medical center if symptoms don't improve, worsen or new problems develop. The patient verbalized understanding. The patient was told to call to get lab results if they haven't heard anything in the next week.   This note has been created with Museum/gallery curator  and smart Company secretary. Any transcriptional errors are unintentional.    Angelica Chessman, MD, [place], [place], [place] and [place], [place]   10/19/2013, 12:31 PM

## 2013-10-19 NOTE — Progress Notes (Signed)
Pt is here today for a physical and a pap smear.

## 2013-10-19 NOTE — Patient Instructions (Signed)
Hipotiroidismo (Hypothyroidism) La tiroides es una glndula grande ubicada en la parte anterior e inferior del cuello. La glndula tiroides interviene en el control del metabolismo. El metabolismo es el modo en que el organismo utiliza los alimentos. El control del metabolismo se realiza a travs de una hormona denominada tiroxina. Cuando la actividad de esta glndula est por debajo de lo normal (hipotiroidismo) produce muy poca cantidad de hormona. CAUSAS Aqu se incluyen:   Ausencia de tejido tiroideo.  Bocio por dficit de yodo.  Bocio por medicamentos.  Defectos congnitos (desde el nacimiento).  Trastornos de la glndula pituitaria Esto ocasiona la falta de TSH (sigla que significa hormona estimulante de la tiroides) Esta hormona le informa a la tiroides que debe producir ms hormona. SNTOMAS  Letargia (sentir que no se tiene Teacher, early years/pre)  Intolerancia al fro  [place] (a pesar de una ingesta normal de alimentos)  Piel seca  [place] seco  Irregularidades menstruales  Enlentecimiento de los procesos de pensamiento La insuficiente cantidad de hormona tiroidea tambin puede ocasionar problemas cardacos. El hipotiroidismo en el recin nacido es el cretinismo en su forma extrema. Es importante que esta forma se trate de modo adecuado e inmediato, ya que puede conducir rpidamente al retardo del desarrollo fsico y mental. DIAGNSTICO Para comprobar la existencia de hipotiroidismo, Mining engineer anlisis de sangre y radiografas y estudios con [place]. Muchas veces los signos estn ocultos. Es necesario que el profesional vigile la enfermedad con anlisis de [place]. Esto se realiza luego de Electrical engineer diagnstico (determinar cul es el problema). Puede ser necesario que el profesional que lo asiste controle esta enfermedad con anlisis de sangre ya sea antes o despus del diagnstico y [place]. TRATAMIENTO Los niveles bajos de hormona tiroidea se  incrementan con el uso de hormona tiroidea sinttica. Este es un tratamiento seguro y [place]. Se dispone de hormona tiroidea sinttica para el tratamiento de este trastorno. Generalmente lleva algunas semanas obtener el efecto total de los medicamentos. Luego de obtener el efecto completo del [place], habitualmente pasan otras cuatro semanas para que los sntomas empiezan a Armed forces operational officer. El profesional podr comenzar indicndole dosis bajas. Si usted tuvo problemas cardacos, la dosis se aumentar de manera gradual. Podr volver a lo normal sin Firefighter una situacin de emergencia. [place] los [employer] ha indicado el profesional que lo asiste. Infrmele al profesional todos los medicamentos que toma o que ha comenzado a Radio producer. El profesional que lo asiste lo ayudar con los esquemas de las dosis.  A medida que obtiene mejora, es necesario aumentar la dosis. Ser necesario Optometrist continuos anlisis de [place], segn lo indique el profesional.  Informe acerca de todos los efectos secundarios que sospeche que podran deberse a los medicamentos. SOLICITE ATENCIN MDICA SI: Solicite atencin mdica si observa:  Sudoracin.  Temblores.  Ansiedad.  Rpida prdida de peso.  Intolerancia al calor.  Cambios emocionales.  Diarrea.  Debilidad. SOLICITE ATENCIN MDICA DE INMEDIATO SI: Presenta dolor en el pecho, una frecuencia cardaca irregular (palpitaciones) o latidos cardacos rpidos. EST SEGURO QUE:   Comprende las instrucciones para el alta mdica.  Controlar su enfermedad.  Solicitar atencin mdica de inmediato segn las indicaciones. Document Released: 12/29/2004 Document Revised: 03/23/2011 [facility] Patient Information 2015 [place]. This information is not intended to replace advice given to you by your health care provider. Make sure you discuss any questions you have with your health care provider.

## 2013-10-19 NOTE — Telephone Encounter (Signed)
Pt came in today and said she was scheduled for an appt at 11:15am today for a physical with a pap. I could not find her appointment in [place]. Pt came by taxi and also missed out on work and is hoping to be seen today. Pt is in waiting room.

## 2013-10-19 NOTE — Telephone Encounter (Signed)
Pt was seen today for pap

## 2013-10-20 LAB — CERVICOVAGINAL ANCILLARY ONLY
Chlamydia: NEGATIVE
NEISSERIA GONORRHEA: NEGATIVE

## 2013-10-20 LAB — CYTOLOGY - PAP

## 2013-10-23 LAB — CERVICOVAGINAL ANCILLARY ONLY
WET PREP (BD AFFIRM): NEGATIVE
Wet Prep (BD Affirm): NEGATIVE
Wet Prep (BD Affirm): POSITIVE — AB

## 2013-10-26 ENCOUNTER — Telehealth: Payer: Self-pay | Admitting: *Deleted

## 2013-10-26 ENCOUNTER — Telehealth: Payer: Self-pay | Admitting: Internal Medicine

## 2013-10-26 DIAGNOSIS — A499 Bacterial infection, unspecified: Secondary | ICD-10-CM

## 2013-10-26 MED ORDER — METRONIDAZOLE 500 MG PO TABS: 500.0000 mg | ORAL_TABLET | Freq: Two times a day (BID) | ORAL | Status: DC

## 2013-10-26 NOTE — Telephone Encounter (Signed)
Pt. Calling nurse CMA back, please f/u

## 2013-10-26 NOTE — Telephone Encounter (Signed)
Left message to return call,

## 2013-10-26 NOTE — Telephone Encounter (Signed)
Rx Flagyl was e-scribe to [place]. Pt aware

## 2014-04-25 ENCOUNTER — Ambulatory Visit: Payer: Self-pay | Attending: Internal Medicine

## 2014-05-16 ENCOUNTER — Ambulatory Visit: Payer: Self-pay

## 2014-09-24 ENCOUNTER — Telehealth: Payer: Self-pay | Admitting: Internal Medicine

## 2014-09-24 NOTE — Telephone Encounter (Signed)
Patient called to request a med refill for levothyroxine (SYNTHROID, LEVOTHROID) 25 MCG tablet. Patient needs enough medication to last her until next OV. Please f/u

## 2014-09-26 NOTE — Telephone Encounter (Signed)
Patient called to request med refill on levothyroxine (SYNTHROID, LEVOTHROID) 25 MCG tablet. Please f/u

## 2014-09-27 NOTE — Telephone Encounter (Signed)
Patient called to request med refill on levothyroxine (SYNTHROID, LEVOTHROID) 25 MCG tablet. Please f/u with pt.

## 2014-09-28 NOTE — Telephone Encounter (Signed)
Patient called to request med refill on levothyroxine (SYNTHROID, LEVOTHROID) 25 MCG tablet. Please f/u with pt.

## 2014-10-01 ENCOUNTER — Other Ambulatory Visit: Payer: Self-pay | Admitting: *Deleted

## 2014-10-01 DIAGNOSIS — E039 Hypothyroidism, unspecified: Secondary | ICD-10-CM

## 2014-10-01 MED ORDER — LEVOTHYROXINE SODIUM 25 MCG PO TABS: 25.0000 ug | ORAL_TABLET | Freq: Every day | ORAL | Status: DC

## 2014-11-08 ENCOUNTER — Ambulatory Visit: Payer: Self-pay | Attending: Internal Medicine

## 2014-11-08 ENCOUNTER — Other Ambulatory Visit: Payer: Self-pay | Admitting: *Deleted

## 2014-11-08 DIAGNOSIS — E039 Hypothyroidism, unspecified: Secondary | ICD-10-CM

## 2014-11-08 MED ORDER — LEVOTHYROXINE SODIUM 25 MCG PO TABS: 25.0000 ug | ORAL_TABLET | Freq: Every day | ORAL | Status: DC

## 2014-11-08 NOTE — Telephone Encounter (Signed)
Patient is here and still expresses the need for this medication, levothyroxine (SYNTHROID, LEVOTHROID) 25 MCG tablet, patient has been calling about this medication since 09/24/2014, these messages were routed to Vicente Males (whom of which is temporarily absent). Pt additionally is expressing the need to change providers, not only because of this situation but also because of lack of availability with provider. Please advise and follow up with this patient at the earliest convenience. Thank you, Fonda Kinder, ASA

## 2014-11-08 NOTE — Telephone Encounter (Signed)
Patient will no longer receive medication refills without scheduling an appointment. Patient has not been seen in a year. Patient received a courtesy refill in September.

## 2014-11-15 ENCOUNTER — Telehealth: Payer: Self-pay | Admitting: Internal Medicine

## 2014-11-15 NOTE — Telephone Encounter (Signed)
Pt. Would like to know if she can get a 10 day refill on her thyroid medication....her appointment with her PCP was reschedule until 12/1....Marland KitchenMarland Kitchen

## 2014-11-16 ENCOUNTER — Other Ambulatory Visit: Payer: Self-pay | Admitting: *Deleted

## 2014-11-16 NOTE — Telephone Encounter (Signed)
Patient had a refill on 11/08/14. Patient should be covered until the end of November. Appointment is scheduled for Dec. 1st.

## 2014-11-29 ENCOUNTER — Ambulatory Visit: Payer: Self-pay | Admitting: Internal Medicine

## 2014-12-13 ENCOUNTER — Ambulatory Visit: Payer: Self-pay | Attending: Internal Medicine | Admitting: Internal Medicine

## 2014-12-13 ENCOUNTER — Encounter: Payer: Self-pay | Admitting: Internal Medicine

## 2014-12-13 VITALS — BP 101/67 | HR 75 | Temp 98.2°F | Resp 18 | Ht 60.5 in | Wt 138.6 lb

## 2014-12-13 DIAGNOSIS — E039 Hypothyroidism, unspecified: Secondary | ICD-10-CM

## 2014-12-13 DIAGNOSIS — Z79899 Other long term (current) drug therapy: Secondary | ICD-10-CM | POA: Insufficient documentation

## 2014-12-13 DIAGNOSIS — D649 Anemia, unspecified: Secondary | ICD-10-CM | POA: Insufficient documentation

## 2014-12-13 LAB — CBC WITH DIFFERENTIAL/PLATELET
BASOS PCT: 0 % (ref 0–1)
Basophils Absolute: 0 10*3/uL (ref 0.0–0.1)
Eosinophils Absolute: 0.3 10*3/uL (ref 0.0–0.7)
Eosinophils Relative: 3 % (ref 0–5)
HEMATOCRIT: 28.4 % — AB (ref 36.0–46.0)
Hemoglobin: 8.6 g/dL — ABNORMAL LOW (ref 12.0–15.0)
Lymphocytes Relative: 28 % (ref 12–46)
Lymphs Abs: 2.8 10*3/uL (ref 0.7–4.0)
MCH: 20.5 pg — ABNORMAL LOW (ref 26.0–34.0)
MCHC: 30.3 g/dL (ref 30.0–36.0)
MCV: 67.8 fL — ABNORMAL LOW (ref 78.0–100.0)
MPV: 10.1 fL (ref 8.6–12.4)
Monocytes Absolute: 1 10*3/uL (ref 0.1–1.0)
Monocytes Relative: 10 % (ref 3–12)
NEUTROS ABS: 6 10*3/uL (ref 1.7–7.7)
NEUTROS PCT: 59 % (ref 43–77)
Platelets: 356 10*3/uL (ref 150–400)
RBC: 4.19 MIL/uL (ref 3.87–5.11)
RDW: 18.3 % — ABNORMAL HIGH (ref 11.5–15.5)
WBC: 10.1 10*3/uL (ref 4.0–10.5)

## 2014-12-13 LAB — LIPID PANEL
CHOLESTEROL: 165 mg/dL (ref 125–200)
HDL: 63 mg/dL (ref >=46)
LDL CALC: 83 mg/dL (ref <130)
Total CHOL/HDL Ratio: 2.6 Ratio (ref <=5.0)
Triglycerides: 93 mg/dL (ref <150)
VLDL: 19 mg/dL (ref <30)

## 2014-12-13 MED ORDER — LEVOTHYROXINE SODIUM 25 MCG PO TABS: 25.0000 ug | ORAL_TABLET | Freq: Every day | ORAL | Status: DC

## 2014-12-13 NOTE — Patient Instructions (Signed)
Hipotiroidismo (Hypothyroidism) El hipotiroidismo es un trastorno de la tiroides, una glndula grande ubicada en la parte anterior e inferior del cuello. La tiroides Administrator hormonas que controlan el funcionamiento del [place]. En los casos de hipotiroidismo, la glndula no produce la cantidad suficiente de estas hormonas. CAUSAS Las causas del hipotiroidismo pueden incluir lo siguiente:  Infecciones virales.  Embarazo.  Un ataque del sistema de defensa (sistema inmunitario) a la tiroides.  Algunos medicamentos.  Defectos de nacimiento.  Radioterapias anteriores en la cabeza o el cuello.  Tratamiento previo con yodo radioactivo.  Extirpacin quirrgica previa de una parte o de toda la tiroides.  Problemas con la glndula ubicada en el centro del cerebro (hipfisis). [place] signos y los sntomas de hipotiroidismo pueden ser los siguientes:  Sensacin de falta de Teacher, early years/pre ([place]).  Incapacidad para tolerar el fro.  Aumento de peso que no puede explicarse por un cambio en la dieta o en los hbitos de ejercicio fsico.  Piel seca.  Pelo grueso.  Irregularidades menstruales.  Ralentizacin de los procesos de pensamiento.  Estreimiento.  Tristeza o depresin. DIAGNSTICO  El mdico puede diagnosticar el hipotiroidismo con anlisis de sangre y ecografas. TRATAMIENTO El hipotiroidismo se trata con medicamentos que reemplazan las hormonas que el cuerpo no produce. Despus de Biochemist, clinical, pueden pasar varias semanas hasta la desaparicin de los sntomas. [place] los medicamentos solamente como se lo haya indicado el mdico.  Si empieza a tomar medicamentos nuevos, infrmele al mdico.  Concurra a todas las visitas de control como se lo haya indicado el mdico. Esto es importante. A medida que la enfermedad mejora, es posible que haya que modificar las dosis. Tendr que hacerse anlisis de sangre  peridicamente, de modo que el mdico pueda controlar la enfermedad. SOLICITE ATENCIN MDICA SI:  Los sntomas no mejoran con Dispensing optician.  Est tomando medicamentos sustitutivos de la tiroides y:  Copywriter, advertising.  Siente temblores.  Est ansioso.  [place] de peso rpidamente.  No puede Agricultural engineer.  Tiene cambios emocionales.  Tiene diarrea.  Se siente dbil. SOLICITE ATENCIN MDICA DE INMEDIATO SI:   Siente dolor en el pecho.  Tiene latidos cardacos irregulares o siente dolor en el pecho.  Nota que la frecuencia cardaca est acelerada.   Esta informacin no tiene Marine scientist el consejo del mdico. Asegrese de hacerle al mdico cualquier pregunta que tenga.   Document Released: 12/29/2004 Document Revised: 01/19/2014 Elsevier Interactive Patient Education [employer].

## 2014-12-13 NOTE — Progress Notes (Signed)
Patient here for F/U Thyroid  Patient denies pain at this time.  Patient needs refills on all medications except rectal cream and Flagyl.

## 2014-12-13 NOTE — Progress Notes (Signed)
Patient ID: Amy Maldonado, female   DOB: 02/28/1975, 39 y.o.   MRN: [id number]   Amy Maldonado, is a 39 y.o. female  [id number]  [id number]  DOB - 04/26/1975  Chief Complaint  Patient presents with  . Follow-up        Subjective:   Amy Maldonado is a 39 y.o. female with a history of hypothyroidism on chronic anemia here today for a follow up visit. She needs refill of her medication. She has no significant complaints today. Patient has No headache, No chest pain, No abdominal pain - No Nausea, No new weakness tingling or numbness, No Cough - SOB.  No problems updated.  ALLERGIES: No Known Allergies  PAST MEDICAL HISTORY: Past Medical History  Diagnosis Date  . Anemia   . Hypothyroidism 2011    MEDICATIONS AT HOME: Prior to Admission medications   Medication Sig Start Date End Date Taking? Authorizing Provider  ferrous sulfate 325 (65 FE) MG tablet Take 1 tablet (325 mg total) by mouth 2 (two) times daily with breakfast and lunch. 09/28/13  Yes Tresa Garter, MD  levothyroxine (SYNTHROID, LEVOTHROID) 25 MCG tablet Take 1 tablet (25 mcg total) by mouth daily before breakfast. 12/13/14  Yes Tresa Garter, MD  hydrocortisone (ANUSOL-HC) 2.5 % rectal cream Place rectally 2 (two) times daily. Patient not taking: Reported on 12/13/2014 09/06/12   Tresa Garter, MD  metroNIDAZOLE (FLAGYL) 500 MG tablet Take 1 tablet (500 mg total) by mouth 2 (two) times daily. Take for 5 days Patient not taking: Reported on 12/13/2014 10/26/13   Tresa Garter, MD     Objective:   Filed Vitals:   12/13/14 1709  BP: 101/67  Pulse: 75  Temp: 98.2 F (36.8 C)  TempSrc: Oral  Resp: 18  Height: 5' 0.5" (1.537 m)  Weight: 138 lb 9.6 oz (62.869 kg)  SpO2: 100%    Exam General appearance : Awake, alert, not in any distress. Speech Clear. Not toxic looking HEENT: Atraumatic and Normocephalic, pupils equally reactive to light and  accomodation Neck: supple, no JVD. No cervical lymphadenopathy.  Chest:Good air entry bilaterally, no added sounds  CVS: S1 S2 regular, no murmurs.  Abdomen: Bowel sounds present, Non tender and not distended with no gaurding, rigidity or rebound. Extremities: B/L Lower Ext shows no edema, both legs are warm to touch Neurology: Awake alert, and oriented X 3, CN II-XII intact, Non focal Skin:No Rash  Data Review Lab Results  Component Value Date   HGBA1C 5.2 09/28/2013   HGBA1C 5.3 05/10/2012     Assessment & Plan   1. Hypothyroidism, unspecified hypothyroidism type  - levothyroxine (SYNTHROID, LEVOTHROID) 25 MCG tablet; Take 1 tablet (25 mcg total) by mouth daily before breakfast.  Dispense: 90 tablet; Refill: 3 - CBC with Differential/Platelet - Lipid panel - TSH - T4, Free  Patient have been counseled extensively about nutrition and exercise  Interpreter was used to communicate directly with patient for the entire encounter including providing detailed patient instructions.   Return in about 6 months (around 06/13/2015) for Hypothyroidism.  The patient was given clear instructions to go to ER or return to medical center if symptoms don't improve, worsen or new problems develop. The patient verbalized understanding. The patient was told to call to get lab results if they haven't heard anything in the next week.   This note has been created with Surveyor, quantity. Any transcriptional errors are unintentional.  Angelica Chessman, MD, [place], Karilyn Cota, [place] and [place], [place]   12/13/2014, 5:55 PM

## 2014-12-14 LAB — TSH: TSH: 4.406 u[IU]/mL (ref 0.350–4.500)

## 2014-12-14 LAB — T4, FREE: FREE T4: 0.92 ng/dL (ref 0.80–1.80)

## 2014-12-17 ENCOUNTER — Telehealth: Payer: Self-pay | Admitting: Internal Medicine

## 2014-12-17 NOTE — Telephone Encounter (Signed)
Medical Assistant used [employer] to contact patient.  Interpreter Name:  Interpreter #: [phone] Medical Assistant left message on patient's home and cell voicemail. Voicemail states to give a call back to [place] with [facility] at [phone].

## 2014-12-17 NOTE — Telephone Encounter (Signed)
Patient called and requested her blood work results, please f/u with pt.  °

## 2014-12-17 NOTE — Telephone Encounter (Signed)
-----   Message from Tresa Garter, MD sent at 12/14/2014  5:47 PM EST ----- Please inform patient had laboratory results are mostly within normal limits but her hemoglobinlow, showing iron deficiency anemia. Advise patient to take iron pill as prescribed.

## 2014-12-19 NOTE — Telephone Encounter (Signed)
-----   Message from Tresa Garter, MD sent at 12/14/2014  5:47 PM EST ----- Please inform patient had laboratory results are mostly within normal limits but her hemoglobinlow, showing iron deficiency anemia. Advise patient to take iron pill as prescribed.

## 2014-12-19 NOTE — Telephone Encounter (Signed)
Medical Assistant used [employer] to contact patient.  Interpreter Name:Diana Interpreter #: [phone]  Patient verified DOB Patient informed of Lab results being mostly normal except hemoglobin being low. Patient informed of results showing low iron deficiency (anemia). Patient informed of iron pill being ordered and is available at [facility] on [address]. Patient asked the exact number of her hemoglobin range.  Patient was reassured by taking iron pills she will have a recheck to ensure the medication is helping to bring her to normal range. Patient stated she would pick up the medication when she gets of work at [employer] today.

## 2014-12-24 ENCOUNTER — Telehealth: Payer: Self-pay | Admitting: Internal Medicine

## 2014-12-24 NOTE — Telephone Encounter (Signed)
Pt. Called requesting a med refill on ferrous sulfate 325 (65 FE) MG tablet. Please f/u with pt.

## 2014-12-25 ENCOUNTER — Other Ambulatory Visit: Payer: Self-pay | Admitting: *Deleted

## 2014-12-25 DIAGNOSIS — E039 Hypothyroidism, unspecified: Secondary | ICD-10-CM

## 2014-12-28 MED ORDER — FERROUS SULFATE 325 (65 FE) MG PO TABS: 325.0000 mg | ORAL_TABLET | Freq: Two times a day (BID) | ORAL | Status: DC

## 2015-01-01 NOTE — Telephone Encounter (Signed)
Patient seen in clinic and medications refilled

## 2015-01-21 ENCOUNTER — Ambulatory Visit: Payer: Self-pay

## 2015-06-05 ENCOUNTER — Ambulatory Visit: Payer: Self-pay | Attending: Internal Medicine

## 2015-10-10 ENCOUNTER — Other Ambulatory Visit (HOSPITAL_COMMUNITY): Admission: RE | Admit: 2015-10-10 | Payer: Self-pay | Source: Ambulatory Visit | Admitting: Internal Medicine

## 2015-10-10 ENCOUNTER — Encounter: Payer: Self-pay | Admitting: Internal Medicine

## 2015-10-10 ENCOUNTER — Ambulatory Visit: Payer: Self-pay | Attending: Internal Medicine | Admitting: Physician Assistant

## 2015-10-10 VITALS — BP 105/71 | HR 69 | Temp 98.0°F | Resp 16 | Wt 148.8 lb

## 2015-10-10 DIAGNOSIS — D508 Other iron deficiency anemias: Secondary | ICD-10-CM | POA: Insufficient documentation

## 2015-10-10 DIAGNOSIS — E039 Hypothyroidism, unspecified: Secondary | ICD-10-CM | POA: Insufficient documentation

## 2015-10-10 DIAGNOSIS — Z01419 Encounter for gynecological examination (general) (routine) without abnormal findings: Secondary | ICD-10-CM

## 2015-10-10 LAB — TSH: TSH: 3.58 mIU/L

## 2015-10-10 LAB — CBC WITH DIFFERENTIAL/PLATELET
BASOS ABS: 0 {cells}/uL (ref 0–200)
Basophils Relative: 0 %
EOS ABS: 198 {cells}/uL (ref 15–500)
Eosinophils Relative: 2 %
HCT: 35.1 % (ref 35.0–45.0)
HEMOGLOBIN: 10.7 g/dL — AB (ref 11.7–15.5)
LYMPHS ABS: 2376 {cells}/uL (ref 850–3900)
Lymphocytes Relative: 24 %
MCH: 22.7 pg — AB (ref 27.0–33.0)
MCHC: 30.5 g/dL — ABNORMAL LOW (ref 32.0–36.0)
MCV: 74.4 fL — AB (ref 80.0–100.0)
Monocytes Absolute: 1089 cells/uL — ABNORMAL HIGH (ref 200–950)
Monocytes Relative: 11 %
NEUTROS ABS: 6237 {cells}/uL (ref 1500–7800)
NEUTROS PCT: 63 %
Platelets: 300 10*3/uL (ref 140–400)
RBC: 4.72 MIL/uL (ref 3.80–5.10)
RDW: 25.3 % — ABNORMAL HIGH (ref 11.0–15.0)
WBC: 9.9 10*3/uL (ref 3.8–10.8)

## 2015-10-10 LAB — BASIC METABOLIC PANEL
BUN: 13 mg/dL (ref 7–25)
CHLORIDE: 106 mmol/L (ref 98–110)
CO2: 22 mmol/L (ref 20–31)
CREATININE: 0.6 mg/dL (ref 0.50–1.10)
Calcium: 9.2 mg/dL (ref 8.6–10.2)
GLUCOSE: 92 mg/dL (ref 65–99)
Potassium: 4.1 mmol/L (ref 3.5–5.3)
Sodium: 138 mmol/L (ref 135–146)

## 2015-10-10 MED ORDER — LEVOTHYROXINE SODIUM 25 MCG PO TABS: 25.0000 ug | ORAL_TABLET | Freq: Every day | ORAL | 3 refills | Status: DC

## 2015-10-10 MED ORDER — FERROUS SULFATE 325 (65 FE) MG PO TABS: 325.0000 mg | ORAL_TABLET | Freq: Two times a day (BID) | ORAL | 3 refills | Status: DC

## 2015-10-10 NOTE — Progress Notes (Signed)
Pt is in the office today for a pap and medication refill

## 2015-10-10 NOTE — Progress Notes (Signed)
Amy Maldonado, is a 40 y.o. female  [id number]  [id number]  DOB - 02/19/1975  Subjective:  Chief Complaint and HPI: Amy Maldonado is a 40 y.o. female here for gyn exam and medication RF.  LMP was 9/12-9/18/2017.  Periods normal but heavy.  Birth control=BTL.  Takes iron bid fairly consistently.  Does SBE monthly and hasn't noticed any lumps.  Taking synthroid daily.  No s/sx of hyper/hypothyroidism.   No problems or complaints today.  [employer] interpreter "Amy Maldonado" used.    ROS:   Constitutional:  No f/c, No night sweats, No unexplained weight loss. EENT:  No vision changes, No blurry vision, No hearing changes. No mouth, throat, or ear problems.  Respiratory: No cough, No SOB Cardiac: No CP, no palpitations GI:  No abd pain, No N/V/D. GU: No Urinary s/sx Musculoskeletal: No joint pain Neuro: No headache, no dizziness, no motor weakness.  Skin: No rash Endocrine:  No polydipsia. No polyuria.  Psych: Denies SI/HI  No problems updated.  ALLERGIES: No Known Allergies  PAST MEDICAL HISTORY: Past Medical History:  Diagnosis Date  . Anemia   . Hypothyroidism 2011    MEDICATIONS AT HOME: Prior to Admission medications   Medication Sig Start Date End Date Taking? Authorizing Provider  ferrous sulfate 325 (65 FE) MG tablet Take 1 tablet (325 mg total) by mouth 2 (two) times daily with breakfast and lunch. 10/10/15  Yes Argentina Donovan, PA-C  levothyroxine (SYNTHROID, LEVOTHROID) 25 MCG tablet Take 1 tablet (25 mcg total) by mouth daily before breakfast. 10/10/15  Yes Argentina Donovan, PA-C  hydrocortisone (ANUSOL-HC) 2.5 % rectal cream Place rectally 2 (two) times daily. Patient not taking: Reported on 10/10/2015 09/06/12   Tresa Garter, MD     Objective:  EXAM:   Vitals:   10/10/15 1510  BP: 105/71  Pulse: 69  Resp: 16  Temp: 98 F (36.7 C)  TempSrc: Oral  SpO2: 100%  Weight: 148 lb 12.8 oz (67.5 kg)    General appearance : A&OX3. NAD.  Non-toxic-appearing HEENT: Atraumatic and Normocephalic.  PERRLA. EOM intact.  TM clear B. Mouth-MMM, post pharynx WNL w/o erythema, No PND. Neck: supple, no JVD. No cervical lymphadenopathy. No thyromegaly Chest/Lungs:  Breathing-non-labored, Good air entry bilaterally, breath sounds normal without rales, rhonchi, or wheezing  CVS: S1 S2 regular, no murmurs, gallops, rubs  Breasts:  B dense without any discrete mass or lump.  B axilla unremarkable.  Abdomen: Bowel sounds present, Non tender and not distended with no gaurding, rigidity or rebound. GU: External genitalia WNL.  Speculum inserted-strawberry cervix , pap taken, bimanual unremarkable Extremities: Bilateral Lower Ext shows no edema, both legs are warm to touch with = pulse throughout Neurology:  CN II-XII grossly intact, Non focal.   Psych:  TP linear. J/I WNL. Normal speech. Appropriate eye contact and affect.  Skin:  No Rash  Data Review Lab Results  Component Value Date   HGBA1C 5.2 09/28/2013   HGBA1C 5.3 05/10/2012     Assessment & Plan   1. Hypothyroidism, unspecified hypothyroidism type - levothyroxine (SYNTHROID, LEVOTHROID) 25 MCG tablet; Take 1 tablet (25 mcg total) by mouth daily before breakfast.  Dispense: 90 tablet; Refill: 3 - TSH - Basic metabolic panel  2. Other iron deficiency anemias - ferrous sulfate 325 (65 FE) MG tablet; Take 1 tablet (325 mg total) by mouth 2 (two) times daily with breakfast and lunch.  Dispense: 180 tablet; Refill: 3 - CBC with Differential/Platelet - Basic metabolic  panel  3. Encounter for routine gynecological examination Encouraged her to continue SBE.  MMG next year per screening guidelines - Cervicovaginal ancillary only   Patient have been counseled extensively about nutrition and exercise  Return in about 6 months (around 04/08/2016) for tsh f/up.  The patient was given clear instructions to go to ER or return to medical center if symptoms don't improve, worsen or  new problems develop. The patient verbalized understanding. The patient was told to call to get lab results if they haven't heard anything in the next week.     Freeman Caldron, PA-C [facility] and [place], [place]   10/10/2015, 3:39 PMPatient ID: [name], female   DOB: 06-24-1975, 40 y.o.   MRN: [id number]

## 2015-10-11 LAB — CERVICOVAGINAL ANCILLARY ONLY
Chlamydia: NEGATIVE
NEISSERIA GONORRHEA: NEGATIVE
WET PREP (BD AFFIRM): NEGATIVE

## 2015-10-16 ENCOUNTER — Telehealth: Payer: Self-pay

## 2015-10-16 ENCOUNTER — Telehealth: Payer: Self-pay | Admitting: Internal Medicine

## 2015-10-16 NOTE — Telephone Encounter (Signed)
Patient returned nurse phone call for lab results

## 2015-10-16 NOTE — Telephone Encounter (Signed)
[employer] [place] Id: [id number] contacted pt to go over lab results pt didn't answer lvm for pt to give me a call at her earliest convenience

## 2015-10-17 NOTE — Telephone Encounter (Signed)
Amy Maldonado [id number] Contacted pt to go over lab results pt is a ware of results and doesn't have any questions or concerns

## 2016-10-11 ENCOUNTER — Other Ambulatory Visit: Payer: Self-pay | Admitting: Physician Assistant

## 2016-10-11 DIAGNOSIS — E039 Hypothyroidism, unspecified: Secondary | ICD-10-CM

## 2016-11-12 ENCOUNTER — Ambulatory Visit: Payer: Self-pay | Attending: Family Medicine | Admitting: Family Medicine

## 2016-11-12 VITALS — BP 99/66 | HR 65 | Temp 98.8°F | Resp 18 | Ht 60.0 in | Wt 153.4 lb

## 2016-11-12 DIAGNOSIS — D509 Iron deficiency anemia, unspecified: Secondary | ICD-10-CM | POA: Insufficient documentation

## 2016-11-12 DIAGNOSIS — Z23 Encounter for immunization: Secondary | ICD-10-CM | POA: Insufficient documentation

## 2016-11-12 DIAGNOSIS — Z862 Personal history of diseases of the blood and blood-forming organs and certain disorders involving the immune mechanism: Secondary | ICD-10-CM

## 2016-11-12 DIAGNOSIS — Z79899 Other long term (current) drug therapy: Secondary | ICD-10-CM | POA: Insufficient documentation

## 2016-11-12 DIAGNOSIS — E039 Hypothyroidism, unspecified: Secondary | ICD-10-CM | POA: Insufficient documentation

## 2016-11-12 MED ORDER — LEVOTHYROXINE SODIUM 25 MCG PO TABS: ORAL_TABLET | ORAL | 0 refills | Status: DC

## 2016-11-12 NOTE — Progress Notes (Signed)
   Subjective:  Patient ID: Amy Maldonado, female    DOB: 17-Aug-1975  Age: 41 y.o. MRN: [id number]  CC: Establish Care   HPI Amy Maldonado presents to reestablish care and hypothyroidism follow up. She denies fatigue, weight changes, heat/cold intolerance, bowel/skin changes or CVS symptoms.  Previous thyroid studies include TSH. History of iron deficiency anemia. She denies any hematochezia or melena. She does reports history of regular, heavy menstrual cycles. She is taking daily iron supplements. Skin complaint: Umbilicus. Onset 2 years ago. Enlargement of tissue. No color change, mass, drainage, or changes to bowel or bladder habits. She does reports history of weight gain over the last few years    Outpatient Medications Prior to Visit  Medication Sig Dispense Refill  . ferrous sulfate 325 (65 FE) MG tablet Take 1 tablet (325 mg total) by mouth 2 (two) times daily with breakfast and lunch. 180 tablet 3  . hydrocortisone (ANUSOL-HC) 2.5 % rectal cream Place rectally 2 (two) times daily. (Patient not taking: Reported on 10/10/2015) 30 g 0  . levothyroxine (SYNTHROID, LEVOTHROID) 25 MCG tablet TAKE 1 TABLET BY MOUTH DAILY BEFORE BREAKFAST 30 tablet 0   No facility-administered medications prior to visit.     ROS Review of Systems  Constitutional: Negative.   HENT: Negative.   Eyes: Negative.   Respiratory: Negative.   Cardiovascular: Negative.   Gastrointestinal: Negative.        Umbilicus complaint  Genitourinary: Negative.   Skin: Negative.   Psychiatric/Behavioral: Negative.    Objective:  BP 99/66 (BP Location: Left Arm, Patient Position: Sitting, Cuff Size: Normal)   Pulse 65   Temp 98.8 F (37.1 C) (Oral)   Resp 18   Ht 5' (1.524 m)   Wt 153 lb 6.4 oz (69.6 kg)   SpO2 100%   BMI 29.96 kg/m   BP/Weight 11/12/2016 10/10/2015 60/07/3708  Systolic BP 99 626 948  Diastolic BP 66 71 67  Wt. (Lbs) 153.4 148.8 138.6  BMI 29.96 28.58 26.61     Physical  Exam  Constitutional: She appears well-developed and well-nourished.  HENT:  Head: Normocephalic and atraumatic.  Right Ear: External ear normal.  Left Ear: External ear normal.  Nose: Nose normal.  Mouth/Throat: Oropharynx is clear and moist.  Eyes: Pupils are equal, round, and reactive to light. Conjunctivae are normal.  Neck: Normal range of motion. Neck supple. No thyromegaly present.  Cardiovascular: Normal rate, regular rhythm, normal heart sounds and intact distal pulses.   Pulmonary/Chest: Effort normal and breath sounds normal.  Abdominal: Soft. Bowel sounds are normal. There is no tenderness.    Skin: Skin is warm and dry.  Psychiatric: She has a normal mood and affect.  Nursing note and vitals reviewed.    Assessment & Plan:   1. Hypothyroidism, unspecified type  - TSH - levothyroxine (SYNTHROID, LEVOTHROID) 25 MCG tablet; TAKE 1 TABLET BY MOUTH DAILY BEFORE BREAKFAST  Dispense: 30 tablet; Refill: 0  2. History of anemia  - CBC - Iron and TIBC  3. Needs flu shot  - Flu Vaccine QUAD 6+ mos PF IM (Fluarix Quad PF)  4. Need for tetanus booster  - Tdap vaccine greater than or equal to 7yo IM     Follow-up: Return in about 3 months (around 02/12/2017) for Thyroid.   Amy Maldonado

## 2016-11-12 NOTE — Patient Instructions (Signed)
Hypothyroidism Hypothyroidism is a disorder of the thyroid. The thyroid is a large gland that is located in the lower front of the neck. The thyroid releases hormones that control how the body works. With hypothyroidism, the thyroid does not make enough of these hormones. What are the causes? Causes of hypothyroidism may include:  Viral infections.  Pregnancy.  Your own defense system (immune system) attacking your thyroid.  Certain medicines.  Birth defects.  Past radiation treatments to your head or neck.  Past treatment with radioactive iodine.  Past surgical removal of part or all of your thyroid.  Problems with the gland that is located in the center of your brain (pituitary).  What are the signs or symptoms? Signs and symptoms of hypothyroidism may include:  Feeling as though you have no energy (lethargy).  Inability to tolerate cold.  Weight gain that is not explained by a change in diet or exercise habits.  Dry skin.  Coarse hair.  Menstrual irregularity.  Slowing of thought processes.  Constipation.  Sadness or depression.  How is this diagnosed? Your health care provider may diagnose hypothyroidism with blood tests and ultrasound tests. How is this treated? Hypothyroidism is treated with medicine that replaces the hormones that your body does not make. After you begin treatment, it may take several weeks for symptoms to go away. Follow these instructions at home:  Take medicines only as directed by your health care provider.  If you start taking any new medicines, tell your health care provider.  Keep all follow-up visits as directed by your health care provider. This is important. As your condition improves, your dosage needs may change. You will need to have blood tests regularly so that your health care provider can watch your condition. Contact a health care provider if:  Your symptoms do not get better with treatment.  You are taking thyroid  replacement medicine and: ? You sweat excessively. ? You have tremors. ? You feel anxious. ? You lose weight rapidly. ? You cannot tolerate heat. ? You have emotional swings. ? You have diarrhea. ? You feel weak. Get help right away if:  You develop chest pain.  You develop an irregular heartbeat.  You develop a rapid heartbeat. This information is not intended to replace advice given to you by your health care provider. Make sure you discuss any questions you have with your health care provider. Document Released: 12/29/2004 Document Revised: 06/06/2015 Document Reviewed: 05/16/2013 Elsevier Interactive Patient Education  2017 Elsevier Inc.  

## 2016-11-12 NOTE — Progress Notes (Signed)
Patient is here for establish care / thyroids   Patient is requesting pap smear

## 2016-11-13 LAB — IRON AND TIBC
IRON SATURATION: 3 % — AB (ref 15–55)
IRON: 15 ug/dL — AB (ref 27–159)
TIBC: 461 ug/dL — AB (ref 250–450)
UIBC: 446 ug/dL — AB (ref 131–425)

## 2016-11-13 LAB — CBC
HEMATOCRIT: 32.5 % — AB (ref 34.0–46.6)
Hemoglobin: 9.3 g/dL — ABNORMAL LOW (ref 11.1–15.9)
MCH: 19.7 pg — AB (ref 26.6–33.0)
MCHC: 28.6 g/dL — AB (ref 31.5–35.7)
MCV: 69 fL — AB (ref 79–97)
Platelets: 365 10*3/uL (ref 150–379)
RBC: 4.73 x10E6/uL (ref 3.77–5.28)
RDW: 23.1 % — AB (ref 12.3–15.4)
WBC: 7 10*3/uL (ref 3.4–10.8)

## 2016-11-13 LAB — TSH: TSH: 3.73 u[IU]/mL (ref 0.450–4.500)

## 2016-11-20 ENCOUNTER — Telehealth: Payer: Self-pay | Admitting: Family Medicine

## 2016-11-20 NOTE — Telephone Encounter (Signed)
Pt called requesting lab results once reviewed , please f/up

## 2016-11-23 NOTE — Telephone Encounter (Signed)
Pt called requesting lab results once reviewed , please advice look into patient chart results are final but no notes from pcp

## 2016-11-25 ENCOUNTER — Other Ambulatory Visit: Payer: Self-pay | Admitting: Family Medicine

## 2016-11-25 DIAGNOSIS — E039 Hypothyroidism, unspecified: Secondary | ICD-10-CM

## 2016-11-25 DIAGNOSIS — D508 Other iron deficiency anemias: Secondary | ICD-10-CM

## 2016-11-25 DIAGNOSIS — N920 Excessive and frequent menstruation with regular cycle: Secondary | ICD-10-CM

## 2016-11-25 MED ORDER — FERROUS SULFATE 325 (65 FE) MG PO TABS: 325.0000 mg | ORAL_TABLET | Freq: Three times a day (TID) | ORAL | 3 refills | Status: DC

## 2016-11-25 MED ORDER — LEVOTHYROXINE SODIUM 25 MCG PO TABS: ORAL_TABLET | ORAL | 3 refills | Status: DC

## 2016-11-25 NOTE — Telephone Encounter (Signed)
Results available. Recommend scheduling imaging.

## 2016-11-25 NOTE — Telephone Encounter (Signed)
Patient return CMA call   Patient was aware and understood

## 2016-11-25 NOTE — Telephone Encounter (Signed)
CMA call regarding lab results & to inform her [place] [facility]   Patient did not answer but left a VM stating the reason of the call & to call back

## 2016-11-25 NOTE — Telephone Encounter (Signed)
-----   Message from Alfonse Spruce, [place] sent at 11/25/2016  6:42 AM EST ----- Thyroid levels normal on current dosage of levothyroxine. Labs that evaluated your blood cells indicates you have chronic anemia. Iron levels are very low. Start taking your iron supplements TID.  Increase your dietary iron intake. Good sources of iron include dark green leafy vegetables, meats, beans, and iron fortified cereals. Due to history of heavy menstrual bleeding and chronic anemia recommend imaging workup to rule out other causes like ovarian cyst or uterine fibroids. Follow up in 3 months.

## 2016-11-30 ENCOUNTER — Ambulatory Visit: Payer: Self-pay | Attending: Family Medicine

## 2016-12-02 ENCOUNTER — Ambulatory Visit (HOSPITAL_COMMUNITY)
Admission: RE | Admit: 2016-12-02 | Discharge: 2016-12-02 | Disposition: A | Discharge status: Home / Self Care | Payer: Self-pay | Source: Ambulatory Visit | Attending: Family Medicine | Admitting: Family Medicine

## 2016-12-02 DIAGNOSIS — N920 Excessive and frequent menstruation with regular cycle: Secondary | ICD-10-CM | POA: Insufficient documentation

## 2016-12-10 ENCOUNTER — Telehealth: Payer: Self-pay | Admitting: Family Medicine

## 2016-12-10 NOTE — Telephone Encounter (Signed)
Pt. Called to check on her ultrasound results. Please f/u with pt.

## 2016-12-10 NOTE — Telephone Encounter (Signed)
Patient is requesting imaging results. Please result and send to [facility] for review.

## 2016-12-11 ENCOUNTER — Other Ambulatory Visit: Payer: Self-pay | Admitting: Family Medicine

## 2016-12-11 DIAGNOSIS — R935 Abnormal findings on diagnostic imaging of other abdominal regions, including retroperitoneum: Secondary | ICD-10-CM

## 2016-12-11 NOTE — Telephone Encounter (Signed)
[employer] used. Terrial Rhodes # [phone] . Called patient and informed of results.

## 2016-12-14 NOTE — Telephone Encounter (Signed)
-----   Message from Alfonse Spruce, [place] sent at 12/11/2016  1:19 PM EST ----- Ovaries normal no mass present. Endometrium is slightly irregular with small amount of fluid, lesion cannot be ruled out. You will be referred to gynecology for further evaluation.

## 2016-12-14 NOTE — Telephone Encounter (Signed)
Error patient was already inform

## 2017-02-12 ENCOUNTER — Ambulatory Visit: Payer: Self-pay | Attending: Family Medicine | Admitting: Family Medicine

## 2017-02-12 VITALS — BP 119/73 | HR 82 | Temp 99.1°F | Resp 16 | Wt 153.0 lb

## 2017-02-12 DIAGNOSIS — E039 Hypothyroidism, unspecified: Secondary | ICD-10-CM | POA: Insufficient documentation

## 2017-02-12 DIAGNOSIS — D509 Iron deficiency anemia, unspecified: Secondary | ICD-10-CM | POA: Insufficient documentation

## 2017-02-12 DIAGNOSIS — Z0000 Encounter for general adult medical examination without abnormal findings: Secondary | ICD-10-CM

## 2017-02-12 MED ORDER — LEVOTHYROXINE SODIUM 25 MCG PO TABS: ORAL_TABLET | ORAL | 2 refills | Status: DC

## 2017-02-12 NOTE — Progress Notes (Signed)
   Subjective:  Patient ID: Amy Maldonado, female    DOB: 1976/01/07  Age: 42 y.o. MRN: [id number]  CC: Follow-up (Thyorid and anemia)   HPI Amy Maldonado presents for hypothyroidism follow up. She denies fatigue, weight changes, heat/cold intolerance, bowel/skin changes or CVS symptoms.  Previous thyroid studies include TSH. History of iron deficiency anemia. She denies any hematochezia or melena. She does reports history of regular, heavy menstrual cycles. She is taking  iron supplements. She reports upcoming gynecologist appointment on Monday.      Outpatient Medications Prior to Visit  Medication Sig Dispense Refill  . hydrocortisone (ANUSOL-HC) 2.5 % rectal cream Place rectally 2 (two) times daily. (Patient not taking: Reported on 10/10/2015) 30 g 0  . ferrous sulfate 325 (65 FE) MG tablet Take 1 tablet (325 mg total) 3 (three) times daily with meals by mouth. 180 tablet 3  . levothyroxine (SYNTHROID, LEVOTHROID) 25 MCG tablet TAKE 1 TABLET BY MOUTH DAILY BEFORE BREAKFAST 30 tablet 3   No facility-administered medications prior to visit.     ROS Review of Systems  Constitutional: Negative.   Respiratory: Negative.   Cardiovascular: Negative.   Gastrointestinal: Negative.   Endocrine: Negative.   Skin: Negative.   Psychiatric/Behavioral: Negative.     Objective:  BP 119/73   Pulse 82   Temp 99.1 F (37.3 C) (Oral)   Resp 16   Wt 153 lb (69.4 kg)   LMP 02/10/2017 (Exact Date)   SpO2 99%   BMI 29.88 kg/m   BP/Weight 02/15/2017 02/12/2017 56/08/1273  Systolic BP 170 017 99  Diastolic BP 70 73 66  Wt. (Lbs) 154.6 153 153.4  BMI 30.19 29.88 29.96     Physical Exam  Constitutional: She appears well-developed and well-nourished.  Neck: Normal range of motion. Neck supple.  Cardiovascular: Normal rate, regular rhythm, normal heart sounds and intact distal pulses.  Pulmonary/Chest: Effort normal and breath sounds normal.  Abdominal: Soft. Bowel sounds are  normal. There is no tenderness.  Skin: Skin is warm and dry.  Psychiatric: She has a normal mood and affect.  Nursing note and vitals reviewed.    Assessment & Plan:   1. Hypothyroidism, unspecified type  - levothyroxine (SYNTHROID, LEVOTHROID) 25 MCG tablet; TAKE 1 TABLET BY MOUTH DAILY BEFORE BREAKFAST  Dispense: 30 tablet; Refill: 2  2. Iron deficiency anemia, unspecified iron deficiency anemia type  - Iron, TIBC and Ferritin Panel - CBC  3. Healthcare maintenance  - HIV antibody (with reflex)      Follow-up: Return in about 3 months (around 05/12/2017) for Hypothyroidism.   Alfonse Spruce FNP

## 2017-02-12 NOTE — Patient Instructions (Signed)
Hipotiroidismo  Hypothyroidism  El hipotiroidismo es un trastorno de la tiroides. una glndula grande ubicada en la parte anterior e inferior del cuello. La tiroides libera hormonas que controlan el funcionamiento del organismo. En los casos de hipotiroidismo, la glndula no produce la cantidad suficiente de estas hormonas.  Cules son las causas?  Las causas del hipotiroidismo pueden incluir lo siguiente:   Infecciones virales.   Embarazo.   Un ataque del sistema de defensa (sistema inmunitario) a la tiroides.   Ciertos medicamentos.   Defectos congnitos.   Radioterapias anteriores en la cabeza o el cuello.   Tratamiento previo con yodo radioactivo.   Extirpacin quirrgica previa de una parte o de toda la tiroides.   Problemas con la glndula ubicada en el centro del cerebro (hipfisis).    Cules son los signos o los sntomas?  Los signos y los sntomas de hipotiroidismo pueden ser los siguientes:   Sensacin de falta de energa (letargo).   Incapacidad para tolerar el fro.   Aumento de peso que no puede explicarse por un cambio en la dieta o en los hbitos de ejercicio fsico.   Piel seca.   Pelo grueso.   Irregularidades menstruales.   Ralentizacin de los procesos de pensamiento.   Estreimiento.   Tristeza o depresin.    Cmo se diagnostica?  El mdico puede diagnosticar el hipotiroidismo con anlisis de sangre y ecografas.  Cmo se trata?  El hipotiroidismo se trata con medicamentos que reemplazan las hormonas que el cuerpo no produce. Despus de comenzar el tratamiento, pueden pasar varias semanas hasta la desaparicin de los sntomas.  Siga estas instrucciones en su casa:   Tome los medicamentos solamente como se lo haya indicado el mdico.   Si empieza a tomar medicamentos nuevos, infrmele al mdico.   Concurra a todas las visitas de control como se lo haya indicado el mdico. Esto es importante. A medida que la enfermedad mejora, es posible que haya que modificar las dosis.  Tendr que hacerse anlisis de sangre peridicamente, de modo que el mdico pueda controlar la enfermedad.  Comunquese con un mdico si:   Los sntomas no mejoran con el tratamiento.   Est tomando medicamentos sustitutivos de la tiroides y:  ? Suda en exceso.  ? Siente temblores.  ? Est ansioso.  ? Baja de peso rpidamente.  ? No puede tolerar el calor.  ? Tiene cambios emocionales.  ? Tiene diarrea.  ? Se siente dbil.  Solicite ayuda de inmediato si:   Siente dolor en el pecho.   Tiene latidos cardacos irregulares o siente dolor en el pecho.   Nota que la frecuencia cardaca est acelerada.  Esta informacin no tiene como fin reemplazar el consejo del mdico. Asegrese de hacerle al mdico cualquier pregunta que tenga.  Document Released: 12/29/2004 Document Revised: 04/06/2016 Document Reviewed: 05/16/2013  Elsevier Interactive Patient Education  2018 [employer] Inc[employer]

## 2017-02-13 LAB — IRON,TIBC AND FERRITIN PANEL
Ferritin: 37 ng/mL (ref 15–150)
Iron Saturation: 7 % — CL (ref 15–55)
Iron: 24 ug/dL — ABNORMAL LOW (ref 27–159)
Total Iron Binding Capacity: 331 ug/dL (ref 250–450)
UIBC: 307 ug/dL (ref 131–425)

## 2017-02-13 LAB — CBC
Hematocrit: 37.1 % (ref 34.0–46.6)
Hemoglobin: 12 g/dL (ref 11.1–15.9)
MCH: 28.8 pg (ref 26.6–33.0)
MCHC: 32.3 g/dL (ref 31.5–35.7)
MCV: 89 fL (ref 79–97)
Platelets: 279 10*3/uL (ref 150–379)
RBC: 4.17 x10E6/uL (ref 3.77–5.28)
RDW: 19.2 % — ABNORMAL HIGH (ref 12.3–15.4)
WBC: 8 10*3/uL (ref 3.4–10.8)

## 2017-02-13 LAB — HIV ANTIBODY (ROUTINE TESTING W REFLEX): HIV Screen 4th Generation wRfx: NONREACTIVE

## 2017-02-15 ENCOUNTER — Encounter: Payer: Self-pay | Admitting: Obstetrics & Gynecology

## 2017-02-15 ENCOUNTER — Other Ambulatory Visit (HOSPITAL_COMMUNITY)
Admission: RE | Admit: 2017-02-15 | Discharge: 2017-02-15 | Disposition: A | Discharge status: Home / Self Care | Payer: Self-pay | Source: Ambulatory Visit | Attending: Obstetrics & Gynecology | Admitting: Obstetrics & Gynecology

## 2017-02-15 ENCOUNTER — Other Ambulatory Visit: Payer: Self-pay | Admitting: Family Medicine

## 2017-02-15 ENCOUNTER — Ambulatory Visit (INDEPENDENT_AMBULATORY_CARE_PROVIDER_SITE_OTHER): Payer: Self-pay | Admitting: Obstetrics & Gynecology

## 2017-02-15 VITALS — BP 125/70 | HR 68 | Wt 154.6 lb

## 2017-02-15 DIAGNOSIS — Z0000 Encounter for general adult medical examination without abnormal findings: Secondary | ICD-10-CM

## 2017-02-15 DIAGNOSIS — D649 Anemia, unspecified: Secondary | ICD-10-CM

## 2017-02-15 DIAGNOSIS — Z124 Encounter for screening for malignant neoplasm of cervix: Secondary | ICD-10-CM

## 2017-02-15 DIAGNOSIS — N920 Excessive and frequent menstruation with regular cycle: Secondary | ICD-10-CM

## 2017-02-15 DIAGNOSIS — D509 Iron deficiency anemia, unspecified: Secondary | ICD-10-CM

## 2017-02-15 DIAGNOSIS — Z1151 Encounter for screening for human papillomavirus (HPV): Secondary | ICD-10-CM

## 2017-02-15 DIAGNOSIS — D75839 Thrombocytosis, unspecified: Secondary | ICD-10-CM

## 2017-02-15 DIAGNOSIS — D473 Essential (hemorrhagic) thrombocythemia: Secondary | ICD-10-CM

## 2017-02-15 MED ORDER — FERROUS SULFATE 325 (65 FE) MG PO TABS
325.0000 mg | ORAL_TABLET | Freq: Three times a day (TID) | ORAL | 3 refills | Status: DC
Start: 2017-02-15 — End: 2017-11-26

## 2017-02-15 NOTE — Progress Notes (Signed)
Patient ID: Amy Maldonado, female   DOB: September 30, 1975, 42 y.o.   MRN: [id number]  No chief complaint on file.   HPI  42 yo married Hispanic P3 here as a referral because of an u/s that showed a possible abnormality. She had the ultrasound because of a hbg of 9.3 that eventually went up to 12 recently. The u/s showed the following:IMPRESSION: Heterogeneous slightly irregular endometrial complex with small amount of endometrial fluid, focal mass lesion at mid uterus not excluded ; consider further evaluation with sonohysterogram for confirmation prior to hysteroscopy. Endometrial sampling should also be considered if patient is at high risk for endometrial carcinoma. (Ref: Radiological Reasoning: Algorithmic Workup of Abnormal Vaginal Bleeding with Endovaginal Sonography and Sonohysterography. AJR 2008; 211:H41-74)  She describes her periods as monthly, lasting 5-6 days. The first 3 days are heavy. She has had a BTL. No treatments to slow down the bleeding. Her TSH is normal.   Past Medical History:  Diagnosis Date  . Anemia   . Hypothyroidism 2011    No past surgical history on file.  Family History  Problem Relation Age of Onset  . Hypertension Mother   . Diabetes Father   . Hypertension Father     Social History Social History   Tobacco Use  . Smoking status: Never Smoker  . Smokeless tobacco: Never Used  Substance Use Topics  . Alcohol use: No  . Drug use: No    No Known Allergies  Current Outpatient Medications  Medication Sig Dispense Refill  . ferrous sulfate 325 (65 FE) MG tablet Take 1 tablet (325 mg total) 3 (three) times daily with meals by mouth. 180 tablet 3  . hydrocortisone (ANUSOL-HC) 2.5 % rectal cream Place rectally 2 (two) times daily. (Patient not taking: Reported on 10/10/2015) 30 g 0  . levothyroxine (SYNTHROID, LEVOTHROID) 25 MCG tablet TAKE 1 TABLET BY MOUTH DAILY BEFORE BREAKFAST 30 tablet 2   No current facility-administered  medications for this visit.     Review of Systems Review of Systems  Blood pressure 125/70, pulse 68, weight 154 lb 9.6 oz (70.1 kg), last menstrual period 02/10/2017.  Physical Exam Physical Exam  Live interpretor present for encounter Breathing, conversing, and ambulating normally Well nourished, well hydrated Hispanic female, no apparent distress  UPT negative, consent signed, time out done Cervix prepped with betadine and grasped with a single tooth tenaculum Uterus sounded to 9 cm Pipelle used for 2 passes with a moderate amount of tissue obtained. She tolerated the procedure well.    Data Reviewed  IMPRESSION: Heterogeneous slightly irregular endometrial complex with small amount of endometrial fluid, focal mass lesion at mid uterus not excluded ; consider further evaluation with sonohysterogram for confirmation prior to hysteroscopy. Endometrial sampling should also be considered if patient is at high risk for endometrial carcinoma. (Ref: Radiological Reasoning: Algorithmic Workup of Abnormal Vaginal Bleeding with Endovaginal Sonography and Sonohysterography. AJR 2008; 081:K48-18)  Assessment    Menorrhagia with previous anemia    Plan    Await biopsy results Come back 2 weeks       Amy Maldonado C Amy Maldonado 02/15/2017, 8:46 AM

## 2017-02-16 ENCOUNTER — Encounter: Payer: Self-pay | Admitting: *Deleted

## 2017-02-16 ENCOUNTER — Telehealth: Payer: Self-pay

## 2017-02-16 NOTE — Telephone Encounter (Signed)
CMA called patient to inform on lab result, referrals,  and PCP advising.   Spanish interpreter Larena Sox [phone] assisted with the call.   Patient understood.

## 2017-02-16 NOTE — Telephone Encounter (Signed)
-----   Message from Alfonse Spruce, [place] sent at 02/15/2017  1:46 PM EST ----- Negative HIV Anemia has improved since previous labs. No anemia present. Platelet count is chronically elevated.  Iron levels have improved but are still low. Continue take iron supplement as prescribed.  You will be referred to hematology.

## 2017-02-17 ENCOUNTER — Encounter: Payer: Self-pay | Admitting: Hematology and Oncology

## 2017-02-17 ENCOUNTER — Telehealth: Payer: Self-pay | Admitting: Hematology and Oncology

## 2017-02-17 LAB — CYTOLOGY - PAP
Diagnosis: NEGATIVE
HPV: NOT DETECTED

## 2017-02-17 NOTE — Telephone Encounter (Signed)
Appt has been scheduled for the Amy Maldonado to see Dr. Lebron Conners on 3/7 at 11am. Notified the referring office to call the Amy Maldonado about the appt. Letter mailed.

## 2017-03-01 ENCOUNTER — Ambulatory Visit (INDEPENDENT_AMBULATORY_CARE_PROVIDER_SITE_OTHER): Payer: Self-pay | Admitting: Obstetrics & Gynecology

## 2017-03-01 ENCOUNTER — Encounter: Payer: Self-pay | Admitting: Obstetrics & Gynecology

## 2017-03-01 VITALS — BP 120/65 | HR 73 | Wt 153.2 lb

## 2017-03-01 DIAGNOSIS — D500 Iron deficiency anemia secondary to blood loss (chronic): Secondary | ICD-10-CM

## 2017-03-01 NOTE — Progress Notes (Signed)
   Subjective:    Patient ID: Amy Maldonado, female    DOB: 08-16-1975, 42 y.o.   MRN: [id number]  HPI  42 yo married Hispanic P3 here for follow up after an EMBX. She had an ultrasound done as part of an evaluation of a hemoglobing of 9.3. That hbg then returned to 12. The ultrasound showed a "slightly irregular endometrial complex". She has normal monthly periods. She has had a BTL.  Review of Systems Diagnosis Endometrium, biopsy - SECRETORY ENDOMETRIUM WITH BREAKDOWN. - NO HYPERPLASIA OR MALIGNANCY.    Objective:   Physical Exam  Breathing, conversing, and ambulating normally Well nourished, well hydrated Hispanic female, no apparent distress       Assessment & Plan:  Normal from a gynecology stand point She will need another pap smear in 3 years.

## 2017-03-18 ENCOUNTER — Inpatient Hospital Stay: Payer: Self-pay | Attending: Hematology and Oncology | Admitting: Hematology and Oncology

## 2017-03-18 ENCOUNTER — Telehealth: Payer: Self-pay | Admitting: Hematology and Oncology

## 2017-03-18 ENCOUNTER — Encounter: Payer: Self-pay | Admitting: Hematology and Oncology

## 2017-03-18 VITALS — BP 122/77 | HR 72 | Temp 98.6°F | Resp 18 | Ht 60.0 in | Wt 154.3 lb

## 2017-03-18 DIAGNOSIS — E039 Hypothyroidism, unspecified: Secondary | ICD-10-CM | POA: Insufficient documentation

## 2017-03-18 DIAGNOSIS — D509 Iron deficiency anemia, unspecified: Secondary | ICD-10-CM | POA: Insufficient documentation

## 2017-03-18 DIAGNOSIS — D500 Iron deficiency anemia secondary to blood loss (chronic): Secondary | ICD-10-CM

## 2017-03-18 NOTE — Telephone Encounter (Signed)
Gave patient avs and calendar with appts per 3/7 los.  °

## 2017-03-28 NOTE — Progress Notes (Signed)
[facility] New Visit:  Assessment: Iron deficiency anemia 42 y.o. female with iron deficiency anemia likely due to blood loss with heavy menstrual periods, currently receiving oral iron supplementation which she appears to be tolerating well.  Between November 2018 and February 2019, there has been significant improvement in the hemoglobin value as well as an iron storage as assessed by iron panel and ferritin.  At this time, I do not see any need to transition patient to parenteral iron supplementation as she clearly absorbs iron well from her digestive tract.  Previous worsening of the anemia was likely due to poor adherence to the iron supplementation.  Plan: -Continue ferrous sulfate 325 mg oral 3 times a day. -Return to my clinic in 6 months with labs 2-3 days prior for continued hematological monitoring.   Voice recognition software was used and creation of this note. Despite my best effort at editing the text, some misspelling/errors may have occurred. Orders Placed This Encounter  Procedures  . CBC with Differential (Cancer Center Only)    Standing Status:   Future    Standing Expiration Date:   03/19/2018  . CMP ([facility] only)    Standing Status:   Future    Standing Expiration Date:   03/19/2018  . Iron and TIBC    Standing Status:   Future    Standing Expiration Date:   03/19/2018  . Ferritin    Standing Status:   Future    Standing Expiration Date:   03/19/2018    All questions were answered.  . The patient knows to call the clinic with any problems, questions or concerns.  This note was electronically signed.    History of Presenting Illness Amy Maldonado 42 y.o. presenting to the [facility] for iron deficiency anemia, referred by Alfonse Spruce, FNP & Dr Emily Filbert.   Past medical history significant for hypothyroidism and no other chronic medical problems.  Patient is currently taking oral iron and reports taking 3 tablets of  ferrous sulfate per day.  Denies any lightheadedness, dizziness, shortness of breath with activity.  No ice cravings.She has history of regular heavy menstrual.  No history of melena.   Oncological/hematological History: --Labs, 10/10/15: Hgb 10.7, MCV 74.4, MCH 22.7, MCHC 30.5, RDW 25.3, Plt 300; --Labs, 11/12/16: Hgb   9.3, MCV 69.0, MCH 19.7, MCHC 28.6, RDW 23.1, Plt 365; Fe 15, FeSat 3%, TIBC 461, Ferritin ...; TSH 3.73  --Labs, 02/12/17: Hgb 12.0, MCV 89.0, MCH 28.8, MCHC 32.3, RDW 19.2, Plt 279; Fe 24, FeSat 7%, TIBC 331, Ferritin 37  Medical History: Past Medical History:  Diagnosis Date  . Anemia   . Hypothyroidism 2011    Surgical History: History reviewed. No pertinent surgical history.  Family History: Family History  Problem Relation Age of Onset  . Hypertension Mother   . Diabetes Father   . Hypertension Father     Social History: Social History   Socioeconomic History  . Marital status: Married    Spouse name: Not on file  . Number of children: Not on file  . Years of education: Not on file  . Highest education level: Not on file  Social Needs  . Financial resource strain: Not on file  . Food insecurity - worry: Not on file  . Food insecurity - inability: Not on file  . Transportation needs - medical: Not on file  . Transportation needs - non-medical: Not on file  Occupational History  . Not  on file  Tobacco Use  . Smoking status: Never Smoker  . Smokeless tobacco: Never Used  Substance and Sexual Activity  . Alcohol use: No  . Drug use: No  . Sexual activity: Yes  Other Topics Concern  . Not on file  Social History Narrative  . Not on file    Allergies: No Known Allergies  Medications:  Current Outpatient Medications  Medication Sig Dispense Refill  . ferrous sulfate 325 (65 FE) MG tablet Take 1 tablet (325 mg total) by mouth 3 (three) times daily with meals. 180 tablet 3  . levothyroxine (SYNTHROID, LEVOTHROID) 25 MCG tablet TAKE 1 TABLET  BY MOUTH DAILY BEFORE BREAKFAST 30 tablet 2  . hydrocortisone (ANUSOL-HC) 2.5 % rectal cream Place rectally 2 (two) times daily. (Patient not taking: Reported on 10/10/2015) 30 g 0   No current facility-administered medications for this visit.     Review of Systems: Review of Systems  Gastrointestinal: Negative for blood in stool.  Genitourinary: Positive for menstrual problem.   All other systems reviewed and are negative.    PHYSICAL EXAMINATION Blood pressure 122/77, pulse 72, temperature 98.6 F (37 C), temperature source Oral, resp. rate 18, height 5' (1.524 m), weight 154 lb 4.8 oz (70 kg), SpO2 100 %.  ECOG PERFORMANCE STATUS: 1 - Symptomatic but completely ambulatory  Physical Exam  Constitutional: She is oriented to person, place, and time and well-developed, well-nourished, and in no distress. No distress.  HENT:  Head: Normocephalic and atraumatic.  Mouth/Throat: Oropharynx is clear and moist. No oropharyngeal exudate.  Eyes: Conjunctivae and EOM are normal. Pupils are equal, round, and reactive to light. No scleral icterus.  Neck: No thyromegaly present.  Cardiovascular: Normal rate, regular rhythm, normal heart sounds and intact distal pulses.  No murmur heard. Pulmonary/Chest: Effort normal and breath sounds normal. No respiratory distress. She has no wheezes. She has no rales.  Abdominal: Soft. Bowel sounds are normal. She exhibits no distension and no mass. There is no tenderness. There is no guarding.  Musculoskeletal: Normal range of motion. She exhibits no edema.  Lymphadenopathy:    She has no cervical adenopathy.  Neurological: She is alert and oriented to person, place, and time. She has normal reflexes. No cranial nerve deficit.  Skin: Skin is warm and dry. No rash noted. She is not diaphoretic. No erythema. No pallor.     LABORATORY DATA: I have personally reviewed the data as listed: No visits with results within 1 Week(s) from this visit.  Latest  known visit with results is:  Office Visit on 02/15/2017  Component Date Value Ref Range Status  . Adequacy 02/15/2017 Satisfactory for evaluation  endocervical/transformation zone component PRESENT.   Final  . Diagnosis 02/15/2017 NEGATIVE FOR INTRAEPITHELIAL LESIONS OR MALIGNANCY.   Final  . HPV 02/15/2017 NOT DETECTED   Final   Normal Reference Range - NOT Detected  . Material Submitted 02/15/2017 CervicoVaginal Pap [ThinPrep Imaged]   Final  . CYTOLOGY - PAP 02/15/2017 PAP RESULT   Final-Edited         Ardath Sax, MD

## 2017-03-28 NOTE — Assessment & Plan Note (Signed)
42 y.o. female with iron deficiency anemia likely due to blood loss with heavy menstrual periods, currently receiving oral iron supplementation which she appears to be tolerating well.  Between November 2018 and February 2019, there has been significant improvement in the hemoglobin value as well as an iron storage as assessed by iron panel and ferritin.  At this time, I do not see any need to transition patient to parenteral iron supplementation as she clearly absorbs iron well from her digestive tract.  Previous worsening of the anemia was likely due to poor adherence to the iron supplementation.  Plan: -Continue ferrous sulfate 325 mg oral 3 times a day. -Return to my clinic in 6 months with labs 2-3 days prior for continued hematological monitoring.

## 2017-05-11 ENCOUNTER — Ambulatory Visit: Payer: Self-pay | Attending: Nurse Practitioner | Admitting: Nurse Practitioner

## 2017-05-11 ENCOUNTER — Encounter: Payer: Self-pay | Admitting: Nurse Practitioner

## 2017-05-11 DIAGNOSIS — Z760 Encounter for issue of repeat prescription: Secondary | ICD-10-CM | POA: Insufficient documentation

## 2017-05-11 DIAGNOSIS — E039 Hypothyroidism, unspecified: Secondary | ICD-10-CM | POA: Insufficient documentation

## 2017-05-11 DIAGNOSIS — D509 Iron deficiency anemia, unspecified: Secondary | ICD-10-CM | POA: Insufficient documentation

## 2017-05-11 MED ORDER — LEVOTHYROXINE SODIUM 25 MCG PO TABS: ORAL_TABLET | ORAL | 1 refills | Status: DC

## 2017-05-11 NOTE — Progress Notes (Signed)
Assessment & Plan:  Amy Maldonado was seen today for establish care and medication refill.  Diagnoses and all orders for this visit:  Hypothyroidism, unspecified type -     TSH -     levothyroxine (SYNTHROID, LEVOTHROID) 25 MCG tablet; TAKE 1 TABLET BY MOUTH DAILY BEFORE BREAKFAST    Patient has been counseled on age-appropriate routine health concerns for screening and prevention. These are reviewed and up-to-date. Referrals have been placed accordingly. Immunizations are up-to-date or declined.    Subjective:   Chief Complaint  Patient presents with  . Establish Care    Patient is here to establsh care for hypothryroidsm.   . Medication Refill   HPI Amy Maldonado 42 y.o. female presents to office today to establish care. She has a history of IDA and hypothyroidism. She is currently being followed by oncology and currently taking oral iron supplement (ferrous sulfate 325mg  TID).   Hypothyroidism Amy Maldonado is a 42 y.o. female who presents for follow up of hypothyroidism. Current symptoms: none . Patient denies change in energy level, diarrhea, heat / cold intolerance, nervousness, palpitations and weight changes. Symptoms have been well-controlled.  Anemia Patient has a history of anemia. Anemia was found by routine CBC.  It has been present for several years.  Associated signs & symptoms: none.  Review of Systems  Constitutional: Negative for fever, malaise/fatigue and weight loss.  HENT: Negative.  Negative for nosebleeds.   Eyes: Negative.  Negative for blurred vision, double vision and photophobia.  Respiratory: Negative.  Negative for cough and shortness of breath.   Cardiovascular: Negative.  Negative for chest pain, palpitations and leg swelling.  Gastrointestinal: Negative.  Negative for heartburn, nausea and vomiting.  Musculoskeletal: Negative.  Negative for myalgias.  Neurological: Negative.  Negative for dizziness, focal weakness, seizures and  headaches.  Psychiatric/Behavioral: Negative.  Negative for suicidal ideas.    Past Medical History:  Diagnosis Date  . Anemia   . Hypothyroidism 2011    Past Surgical History:  Procedure Laterality Date  . CESAREAN SECTION     3 c section    Family History  Problem Relation Age of Onset  . Hypertension Mother   . Diabetes Father   . Hypertension Father     Social History Reviewed with no changes to be made today.   Outpatient Medications Prior to Visit  Medication Sig Dispense Refill  . ferrous sulfate 325 (65 FE) MG tablet Take 1 tablet (325 mg total) by mouth 3 (three) times daily with meals. 180 tablet 3  . levothyroxine (SYNTHROID, LEVOTHROID) 25 MCG tablet TAKE 1 TABLET BY MOUTH DAILY BEFORE BREAKFAST 30 tablet 2  . hydrocortisone (ANUSOL-HC) 2.5 % rectal cream Place rectally 2 (two) times daily. (Patient not taking: Reported on 10/10/2015) 30 g 0   No facility-administered medications prior to visit.     No Known Allergies     Objective:    BP 107/72 (BP Location: Left Arm, Patient Position: Sitting, Cuff Size: Normal)   Pulse 74   Temp 98.7 F (37.1 C) (Oral)   Ht 5' (1.524 m)   Wt 157 lb 3.2 oz (71.3 kg)   SpO2 98%   BMI 30.70 kg/m  Wt Readings from Last 3 Encounters:  05/11/17 157 lb 3.2 oz (71.3 kg)  03/18/17 154 lb 4.8 oz (70 kg)  03/01/17 153 lb 3.2 oz (69.5 kg)    Physical Exam  Constitutional: She is oriented to person, place, and time. She appears well-developed and  well-nourished. She is cooperative.  HENT:  Head: Normocephalic and atraumatic.  Eyes: EOM are normal.  Neck: Normal range of motion.  Cardiovascular: Normal rate, regular rhythm and normal heart sounds. Exam reveals no gallop and no friction rub.  No murmur heard. Pulmonary/Chest: Effort normal and breath sounds normal. No tachypnea. No respiratory distress. She has no decreased breath sounds. She has no wheezes. She has no rhonchi. She has no rales. She exhibits no tenderness.   Abdominal: Soft. Bowel sounds are normal.  Musculoskeletal: Normal range of motion. She exhibits no edema.  Neurological: She is alert and oriented to person, place, and time. Coordination normal.  Skin: Skin is warm and dry.  Psychiatric: She has a normal mood and affect. Her behavior is normal. Judgment and thought content normal.  Nursing note and vitals reviewed.      Patient has been counseled extensively about nutrition and exercise as well as the importance of adherence with medications and regular follow-up. The patient was given clear instructions to go to ER or return to medical center if symptoms don't improve, worsen or new problems develop. The patient verbalized understanding.   Follow-up: Return in about 1 year (around 05/12/2018).   Gildardo Pounds, FNP-BC [facility] and [facility], [place]   05/11/2017, 4:47 PM

## 2017-05-11 NOTE — Patient Instructions (Signed)
Hipotiroidismo Hypothyroidism El hipotiroidismo es un trastorno de la tiroides. una glndula grande ubicada en la parte anterior e inferior del cuello. La tiroides Administrator hormonas que controlan el funcionamiento del [place]. En los casos de hipotiroidismo, la glndula no produce la cantidad suficiente de estas hormonas. Cules son las causas? Las causas del hipotiroidismo pueden incluir lo siguiente:  Infecciones virales.  Embarazo.  Un ataque del sistema de defensa (sistema inmunitario) a la tiroides.  Ciertos medicamentos.  Defectos congnitos.  Radioterapias anteriores en la cabeza o el cuello.  Tratamiento previo con yodo radioactivo.  Extirpacin quirrgica previa de una parte o de toda la tiroides.  Problemas con la glndula ubicada en el centro del cerebro (hipfisis).  Cules son los signos o los sntomas? Los signos y los sntomas de hipotiroidismo pueden ser los siguientes:  Sensacin de falta de Teacher, early years/pre ([place]).  Incapacidad para tolerar el fro.  Aumento de peso que no puede explicarse por un cambio en la dieta o en los hbitos de ejercicio fsico.  Piel seca.  Pelo grueso.  Irregularidades menstruales.  Ralentizacin de los procesos de pensamiento.  Estreimiento.  Tristeza o depresin.  Cmo se diagnostica? El mdico puede diagnosticar el hipotiroidismo con anlisis de sangre y Engineer, materials. Cmo se trata? El hipotiroidismo se trata con medicamentos que reemplazan las hormonas que el cuerpo no produce. Despus de Biochemist, clinical, pueden pasar varias semanas hasta la desaparicin de los sntomas. Siga estas instrucciones en su casa:  Tome los medicamentos solamente como se lo haya indicado el mdico.  Si empieza a tomar medicamentos nuevos, infrmele al mdico.  Consulting civil engineer a todas las visitas de control como se lo haya indicado el mdico. Esto es importante. A medida que la enfermedad mejora, es posible que haya que modificar las dosis.  Tendr que hacerse anlisis de sangre peridicamente, de modo que el mdico pueda controlar la enfermedad. Comunquese con un mdico si:  Los sntomas no mejoran con Dispensing optician.  Est tomando medicamentos sustitutivos de la tiroides y: ? Copywriter, advertising. ? Siente temblores. ? Est ansioso. ? [place] de peso rpidamente. ? No puede Agricultural engineer. ? Tiene cambios emocionales. ? Tiene diarrea. ? Se siente dbil. Solicite ayuda de inmediato si:  Electronics engineer.  Tiene latidos cardacos irregulares o siente dolor en el pecho.  Nota que la frecuencia cardaca est acelerada. Esta informacin no tiene Marine scientist el consejo del mdico. Asegrese de hacerle al mdico cualquier pregunta que tenga. Document Released: 12/29/2004 Document Revised: 04/06/2016 Document Reviewed: 05/16/2013 Elsevier Interactive Patient Education  2018 [place] Hyperthyroidism El hipertiroidismo ocurre cuando la tiroides est demasiado activa (hiperactiva). La tiroides es una glndula grande ubicada en el cuello que ayuda a Chief Technology Officer la forma en que el organismo [place] los alimentos (metabolismo). Cuando la tiroides est hiperactiva, produce una cantidad excesiva de una hormona llamada tiroxina. Cules son las causas? Las causas del hipertiroidismo pueden incluir lo siguiente:  Enfermedad de [place]. Se produce cuando el sistema inmunitario ataca la tiroides. Esta es la causa ms frecuente.  Inflamacin de la tiroides.  Tumor en la tiroides o Manufacturing systems engineer.  Uso excesivo de medicamentos para la tiroides, entre ellos: ? Suplementos recetados para la tiroides. ? Suplementos a base de hierbas que imitan a las hormonas tiroideas.  Bultos slidos o llenos de lquido en la tiroides (ndulos tiroideos).  Ingesta excesiva de yodo.  Qu incrementa el riesgo?  Ser mujer.  Tener antecedentes familiares de enfermedades tiroideas. Cules  son los signos o los  sntomas? Los signos y los sntomas de hipertiroidismo pueden ser los siguientes:  Nerviosismo.  Incapacidad para Agricultural engineer.  Prdida de peso sin causa aparente.  Diarrea.  Cambios en la textura del pelo o la piel.  Falta de latidos cardacos o ms latidos cardacos.  Frecuencia cardaca acelerada.  Ausencia de la [employer].  Temblores en las manos.  Fatiga.  Agitacin.  Aumento del apetito.  Problemas para dormir.  Aumento del tamao de la tiroides o ndulos.  Cmo se diagnostica? El diagnstico de hipertiroidismo puede incluir lo siguiente:  Examen fsico y antecedentes mdicos.  Anlisis de [place].  Ecografas.  Cmo se trata? El tratamiento puede incluir lo siguiente:  Medicamentos para Aeronautical engineer tiroides.  Ciruga para extirpar la tiroides.  Radioterapia.  Siga estas instrucciones en su casa:  Tome los medicamentos solamente como se lo haya indicado el mdico.  No consuma ningn producto que contenga tabaco, lo que incluye cigarrillos, tabaco de Higher education careers adviser o Psychologist, sport and exercise. Si necesita ayuda para dejar de fumar, consulte al mdico.  No reanude la actividad fsica ni los ejercicios hasta que el mdico lo autorice.  Concurra a todas las visitas de control, segn le indique su mdico. Esto es importante. Comunquese con un mdico si:  Los sntomas no mejoran con Dispensing optician.  Tiene fiebre.  Est tomando medicamentos sustitutivos de la tiroides y: ? Est deprimido. ? Se siente mental y fsicamente lento. ? Aumenta de [place]. Solicite ayuda de inmediato si:  Disminuy su estado de alerta o hay cambios en la conciencia.  Siente dolor abdominal.  Siente mareos.  Tiene una frecuencia cardaca acelerada.  Tiene latidos cardacos irregulares. Esta informacin no tiene Marine scientist el consejo del mdico. Asegrese de hacerle al mdico cualquier pregunta que tenga. Document Released: 12/29/2004 Document Revised:  04/06/2016 Document Reviewed: 05/16/2013 Elsevier Interactive Patient Education  2018 [employer].

## 2017-05-12 ENCOUNTER — Other Ambulatory Visit: Payer: Self-pay | Admitting: Nurse Practitioner

## 2017-05-12 DIAGNOSIS — E039 Hypothyroidism, unspecified: Secondary | ICD-10-CM

## 2017-05-12 LAB — TSH: TSH: 4.7 u[IU]/mL — ABNORMAL HIGH (ref 0.450–4.500)

## 2017-05-14 ENCOUNTER — Telehealth: Payer: Self-pay

## 2017-05-14 NOTE — Telephone Encounter (Signed)
CMA spoke to patient to inform on lab results and to make a lab appt. In 4 weeks for a thyroid re-check.  Patient understood. Patient verified DOB.   Spanish interpreter Leanna Sato [phone] assist with the call.

## 2017-05-14 NOTE — Telephone Encounter (Signed)
-----   Message from Gildardo Pounds, NP sent at 05/12/2017  8:52 PM EDT ----- Thyroid level is elevated. Please make sure you are taking your thyroid medication everyday at the same time and 30 minutes before you eat or take any other medications. Continue on your current dose of synthroid and we will check your TSH again in 4 weeks. Please make a lab appointment.

## 2017-05-31 ENCOUNTER — Ambulatory Visit: Payer: Self-pay

## 2017-06-11 ENCOUNTER — Ambulatory Visit: Payer: Self-pay | Attending: Nurse Practitioner

## 2017-06-11 DIAGNOSIS — E039 Hypothyroidism, unspecified: Secondary | ICD-10-CM | POA: Insufficient documentation

## 2017-06-11 NOTE — Progress Notes (Signed)
Patient here for lab visit only 

## 2017-06-12 LAB — TSH: TSH: 4.54 u[IU]/mL — ABNORMAL HIGH (ref 0.450–4.500)

## 2017-06-14 ENCOUNTER — Telehealth: Payer: Self-pay

## 2017-06-14 NOTE — Telephone Encounter (Signed)
CMA spoke to patient to inform on lab results and advising.   Pt. Understood. Pt. Verified DOB.   Spanish interpreter [place] [phone] assist with the call.

## 2017-06-14 NOTE — Telephone Encounter (Signed)
-----   Message from Gildardo Pounds, NP sent at 06/13/2017 10:36 PM EDT ----- Tsh still slightly elevated but improved. Will check again in 4 weeks. Continue on your current dose of thyroid medication and take it 30 minutes before you eat or take any other medication.

## 2017-07-09 ENCOUNTER — Ambulatory Visit: Payer: Self-pay | Attending: Nurse Practitioner

## 2017-07-09 DIAGNOSIS — E039 Hypothyroidism, unspecified: Secondary | ICD-10-CM | POA: Insufficient documentation

## 2017-07-09 NOTE — Progress Notes (Signed)
Patient here for lab visit  

## 2017-07-10 LAB — TSH: TSH: 3.65 u[IU]/mL (ref 0.450–4.500)

## 2017-07-16 ENCOUNTER — Telehealth: Payer: Self-pay | Admitting: Nurse Practitioner

## 2017-07-16 ENCOUNTER — Telehealth: Payer: Self-pay

## 2017-07-16 NOTE — Telephone Encounter (Signed)
Patient aware  Of her lab results

## 2017-07-16 NOTE — Telephone Encounter (Signed)
CMA attempt to call patient to inform on lab results.  No answer and left a VM for patient to call back.  If patient call back, please inform:  Thyroid levels are normal. Continue on 81mcg at this time.

## 2017-07-16 NOTE — Telephone Encounter (Signed)
-----   Message from Gildardo Pounds, NP sent at 07/11/2017  1:28 PM EDT ----- Thyroid levels are normal. Continue on 22mcg at this time.

## 2017-09-20 ENCOUNTER — Inpatient Hospital Stay: Payer: Self-pay | Attending: Hematology and Oncology

## 2017-09-20 DIAGNOSIS — E039 Hypothyroidism, unspecified: Secondary | ICD-10-CM | POA: Insufficient documentation

## 2017-09-20 DIAGNOSIS — D500 Iron deficiency anemia secondary to blood loss (chronic): Secondary | ICD-10-CM | POA: Insufficient documentation

## 2017-09-20 DIAGNOSIS — N920 Excessive and frequent menstruation with regular cycle: Secondary | ICD-10-CM | POA: Insufficient documentation

## 2017-09-20 DIAGNOSIS — E1165 Type 2 diabetes mellitus with hyperglycemia: Secondary | ICD-10-CM | POA: Insufficient documentation

## 2017-09-20 LAB — CMP (CANCER CENTER ONLY)
ALK PHOS: 79 U/L (ref 38–126)
ALT: 45 U/L — ABNORMAL HIGH (ref 0–44)
ANION GAP: 8 (ref 5–15)
AST: 24 U/L (ref 15–41)
Albumin: 3.7 g/dL (ref 3.5–5.0)
BUN: 14 mg/dL (ref 6–20)
CALCIUM: 8.8 mg/dL — AB (ref 8.9–10.3)
CO2: 24 mmol/L (ref 22–32)
Chloride: 105 mmol/L (ref 98–111)
Creatinine: 1.1 mg/dL — ABNORMAL HIGH (ref 0.44–1.00)
GFR, Estimated: >60 mL/min (ref >60)
GLUCOSE: 225 mg/dL — AB (ref 70–99)
POTASSIUM: 4.1 mmol/L (ref 3.5–5.1)
SODIUM: 137 mmol/L (ref 135–145)
TOTAL PROTEIN: 6.8 g/dL (ref 6.5–8.1)
Total Bilirubin: <0.2 mg/dL — ABNORMAL LOW (ref 0.3–1.2)

## 2017-09-20 LAB — CBC WITH DIFFERENTIAL (CANCER CENTER ONLY)
BASOS ABS: 0 10*3/uL (ref 0.0–0.1)
BASOS PCT: 0 %
Eosinophils Absolute: 0.1 10*3/uL (ref 0.0–0.5)
Eosinophils Relative: 2 %
HEMATOCRIT: 30.6 % — AB (ref 34.8–46.6)
Hemoglobin: 10 g/dL — ABNORMAL LOW (ref 11.6–15.9)
LYMPHS PCT: 23 %
Lymphs Abs: 1.9 10*3/uL (ref 0.9–3.3)
MCH: 29.9 pg (ref 25.1–34.0)
MCHC: 32.7 g/dL (ref 31.5–36.0)
MCV: 91.6 fL (ref 79.5–101.0)
MONO ABS: 0.6 10*3/uL (ref 0.1–0.9)
Monocytes Relative: 7 %
NEUTROS ABS: 5.4 10*3/uL (ref 1.5–6.5)
NEUTROS PCT: 68 %
PLATELETS: 250 10*3/uL (ref 145–400)
RBC: 3.34 MIL/uL — AB (ref 3.70–5.45)
RDW: 14.2 % (ref 11.2–14.5)
WBC: 7.9 10*3/uL (ref 3.9–10.3)

## 2017-09-21 LAB — FERRITIN: Ferritin: 33 ng/mL (ref 11–307)

## 2017-09-21 LAB — IRON AND TIBC
Iron: 83 ug/dL (ref 41–142)
SATURATION RATIOS: 24 % (ref 21–57)
TIBC: 340 ug/dL (ref 236–444)
UIBC: 257 ug/dL

## 2017-09-23 ENCOUNTER — Telehealth: Payer: Self-pay | Admitting: Hematology

## 2017-09-23 ENCOUNTER — Inpatient Hospital Stay: Payer: Self-pay | Admitting: Hematology and Oncology

## 2017-09-23 NOTE — Telephone Encounter (Signed)
Former [facility] patient moved from 9/12 to 9/13 with YF - ok per [facility]. Patient has new appointment.

## 2017-09-24 ENCOUNTER — Inpatient Hospital Stay (HOSPITAL_BASED_OUTPATIENT_CLINIC_OR_DEPARTMENT_OTHER): Payer: Self-pay | Admitting: Hematology

## 2017-09-24 ENCOUNTER — Encounter: Payer: Self-pay | Admitting: Hematology

## 2017-09-24 ENCOUNTER — Telehealth: Payer: Self-pay | Admitting: Hematology

## 2017-09-24 VITALS — BP 125/76 | HR 76 | Temp 98.4°F | Resp 18 | Ht 60.0 in | Wt 161.8 lb

## 2017-09-24 DIAGNOSIS — D500 Iron deficiency anemia secondary to blood loss (chronic): Secondary | ICD-10-CM

## 2017-09-24 DIAGNOSIS — E039 Hypothyroidism, unspecified: Secondary | ICD-10-CM

## 2017-09-24 DIAGNOSIS — N920 Excessive and frequent menstruation with regular cycle: Secondary | ICD-10-CM

## 2017-09-24 DIAGNOSIS — R739 Hyperglycemia, unspecified: Secondary | ICD-10-CM | POA: Insufficient documentation

## 2017-09-24 DIAGNOSIS — E1165 Type 2 diabetes mellitus with hyperglycemia: Secondary | ICD-10-CM

## 2017-09-24 NOTE — Telephone Encounter (Signed)
Scheduled appt per 9/13 los - gave patient AVS and calender per los.  

## 2017-09-24 NOTE — Progress Notes (Signed)
[facility]  [phone] [phone]  Clinic Follow up Note   Patient Care Team: Gildardo Pounds, NP as PCP - General (Nurse Practitioner) 09/24/2017  CHIEF COMPLAIN: f/u anemia   History of Presenting Illness (According to Dr. Lebron Conners on 03/18/2017) Amy Maldonado 42 y.o. presenting to the [facility] for iron deficiency anemia, referred by Alfonse Spruce, FNP & Dr Emily Filbert.   Past medical history significant for hypothyroidism and no other chronic medical problems. Her iron deficiency anemia likely due to blood loss with heavy menstrual periods, currently receiving oral iron supplementation which she appears to be tolerating well.  Between November 2018 and February 2019, there has been significant improvement in the hemoglobin value as well as an iron storage as assessed by iron panel and ferritin.  At this time, I do not see any need to transition patient to parenteral iron supplementation as she clearly absorbs iron well from her digestive tract.  Previous worsening of the anemia was likely due to poor adherence to the iron supplementation.  CURRENT THERAPY  Continue ferrous sulfate 325 mg oral 3 times a day.  INTERVAL HISTORY: Amy Maldonado is a 42 y.o. female who is here for follow-up. She used to see Dr. Lebron Conners before and was last seen by him on 03/18/2017. [name] for the first time at my clinic.  She is here alone at the clinic. She speaks Spanish and I used a Heritage manager. She's been anemic for almost 3 years. She is compliant with iron pills and takes them 3 times a day. She denies constipation. Her menses are irregular and are 7 days in duration with heavy flow. She denies other bleeding. She is compliant with her synthroid 25 mg, and has been on this dose for almost 2 years now. LMP was last Thursday on 09/16/2017. She said that she had IV iron when she delivered her second child and she tolerated well.   Her  father had prostate cancer.  No other family history of malignancy.  REVIEW OF SYSTEMS:   Constitutional: Denies fevers, chills or abnormal weight loss Eyes: Denies blurriness of vision Ears, nose, mouth, throat, and face: Denies mucositis or sore throat Respiratory: Denies cough, dyspnea or wheezes Cardiovascular: Denies palpitation, chest discomfort or lower extremity swelling Gastrointestinal:  Denies nausea, heartburn or change in bowel habits Skin: Denies abnormal skin rashes Lymphatics: Denies new lymphadenopathy or easy bruising Neurological:Denies numbness, tingling or new weaknesses Behavioral/Psych: Mood is stable, no new changes  All other systems were reviewed with the patient and are negative.  MEDICAL HISTORY:  Past Medical History:  Diagnosis Date  . Anemia   . Hypothyroidism 2011    SURGICAL HISTORY: Past Surgical History:  Procedure Laterality Date  . CESAREAN SECTION     3 c section    I have reviewed the social history and family history with the patient and they are unchanged from previous note.  ALLERGIES:  has No Known Allergies.  MEDICATIONS:  Current Outpatient Medications  Medication Sig Dispense Refill  . ferrous sulfate 325 (65 FE) MG tablet Take 1 tablet (325 mg total) by mouth 3 (three) times daily with meals. 180 tablet 3  . levothyroxine (SYNTHROID, LEVOTHROID) 25 MCG tablet TAKE 1 TABLET BY MOUTH DAILY BEFORE BREAKFAST 90 tablet 1   No current facility-administered medications for this visit.     PHYSICAL EXAMINATION:  ECOG PERFORMANCE STATUS: 0 - Asymptomatic  Vitals:   09/24/17 1358  BP: 125/76  Pulse: 76  Resp: 18  Temp: 98.4 F (36.9 C)  SpO2: 100%   Filed Weights   09/24/17 1358  Weight: 161 lb 12.8 oz (73.4 kg)    GENERAL:alert, no distress and comfortable SKIN: skin color, texture, turgor are normal, no rashes or significant lesions EYES: normal, Conjunctiva are pink and non-injected, sclera clear OROPHARYNX:no  exudate, no erythema and lips, buccal mucosa, and tongue normal  NECK: supple, thyroid normal size, non-tender, without nodularity LYMPH:  no palpable lymphadenopathy in the cervical, axillary or inguinal LUNGS: clear to auscultation and percussion with normal breathing effort HEART: regular rate & rhythm and no murmurs and no lower extremity edema ABDOMEN:abdomen soft, non-tender and normal bowel sounds Musculoskeletal:no cyanosis of digits and no clubbing  NEURO: alert & oriented x 3 with fluent speech, no focal motor/sensory deficits  LABORATORY DATA:  I have reviewed the data as listed CBC Latest Ref Rng & Units 09/20/2017 02/12/2017 11/12/2016  WBC 3.9 - 10.3 K/uL 7.9 8.0 7.0  Hemoglobin 11.6 - 15.9 g/dL 10.0(L) 12.0 9.3(L)  Hematocrit 34.8 - 46.6 % 30.6(L) 37.1 32.5(L)  Platelets 145 - 400 K/uL 250 279 365     CMP Latest Ref Rng & Units 09/20/2017 10/10/2015 09/06/2012  Glucose 70 - 99 mg/dL 225(H) 92 82  BUN 6 - 20 mg/dL 14 13 10   Creatinine 0.44 - 1.00 mg/dL 1.10(H) 0.60 0.53  Sodium 135 - 145 mmol/L 137 138 140  Potassium 3.5 - 5.1 mmol/L 4.1 4.1 4.0  Chloride 98 - 111 mmol/L 105 106 104  CO2 22 - 32 mmol/L 24 22 27   Calcium 8.9 - 10.3 mg/dL 8.8(L) 9.2 9.4  Total Protein 6.5 - 8.1 g/dL 6.8 - 7.7  Total Bilirubin 0.3 - 1.2 mg/dL <0.2(L) - 0.3  Alkaline Phos 38 - 126 U/L 79 - 53  AST 15 - 41 U/L 24 - 19  ALT 0 - 44 U/L 45(H) - 23      RADIOGRAPHIC STUDIES: I have personally reviewed the radiological images as listed and agreed with the findings in the report. No results found.   ASSESSMENT & PLAN:  Amy Maldonado is a 42 y.o. female with history of hypothyroidism  1.  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  She has mild intermittent anemia for several years, likely secondary to menorrhagia, no other clinical evidence of bleeding.  -she has been on ferrous sulfate 325 mg oral 3 times a day, iron  study few days ago showed improved transferrin saturation, serum iron level, and ferritin, although still on the low end, her microcytosis has resolved also. However she has developed anemia again with hemoglobin 10, her level of anemia is out of portion of her iron deficiency.  I suspect she may have a component of anemia of chronic disease. -Due to her mild to moderate anemia, I recommend her to try IV iron Feraheme once next week.  The potential benefit and side effects, especially allergy reaction including anaphylactic reaction, discussed with her.  She previously had IV iron in [place] and tolerated well.  She agrees to proceed. -Continue lab monitoring -Lab in 6 weeks to monitor her response to IV iron.  2. Hypothyroidism -She's been on 25 mg Synthroid for almost 3 years now -She is complaint and doing well.  3. Hyperglycemia -CMP from 09/20/2017 showed BG of 225.  This is concerning for diabetes or impaired glucose tolerance.  She states that she had  food before labs were done.  -I advised her to lower her sugar and carbohydrate intake and maintain a healthy diet.  -I educated her about how uncontrolled DM can worsen anemia.  -I advised her to see her PCP and get her HbA1c checked.   4. Cancer screening  -I advised her to schedule for a mammogram at [facility] as she never had one.  She agrees.  Her interpreter Gregary Signs will help her to set up with our community reach out a program since she does not have insurance.   Plan -f/u with PCP for hyperglycemia, check HbA1c -I will schedule for IV iron in 1-2 weeks, once  -Labs in 6 weeks -F/u in 3 months with labs a few days before.   No problem-specific Assessment & Plan notes found for this encounter.   No orders of the defined types were placed in this encounter.  All questions were answered. The patient knows to call the clinic with any problems, questions or concerns. No barriers to learning was detected. I spent 25 minutes counseling the  patient face to face. The total time spent in the appointment was 30 minutes and more than 50% was on counseling and review of test results  I, Noor Dweik am acting as scribe for Dr. Truitt Merle.  I have reviewed the above documentation for accuracy and completeness, and I agree with the above.      Truitt Merle, MD 09/24/2017

## 2017-09-27 ENCOUNTER — Other Ambulatory Visit: Payer: Self-pay | Admitting: Obstetrics and Gynecology

## 2017-09-27 DIAGNOSIS — Z1231 Encounter for screening mammogram for malignant neoplasm of breast: Secondary | ICD-10-CM

## 2017-10-01 ENCOUNTER — Inpatient Hospital Stay: Payer: Self-pay

## 2017-10-01 ENCOUNTER — Other Ambulatory Visit (HOSPITAL_COMMUNITY): Payer: Self-pay | Admitting: *Deleted

## 2017-10-01 DIAGNOSIS — Z0000 Encounter for general adult medical examination without abnormal findings: Secondary | ICD-10-CM

## 2017-10-01 LAB — HEMOGLOBIN A1C
HEMOGLOBIN A1C: 7.1 % — AB (ref 4.8–5.6)
Mean Plasma Glucose: 157.07 mg/dL

## 2017-10-01 LAB — LIPID PANEL
Cholesterol: 195 mg/dL (ref 0–200)
HDL: 47 mg/dL (ref >40)
LDL Cholesterol: 128 mg/dL — ABNORMAL HIGH (ref 0–99)
Total CHOL/HDL Ratio: 4.1 RATIO
Triglycerides: 98 mg/dL (ref <150)
VLDL: 20 mg/dL (ref 0–40)

## 2017-10-01 NOTE — Progress Notes (Unsigned)
[facility] initial screening  Spanish interpreter- Sherolyn Buba, from [facility]   Clinical Measurement:  Height:  62in. Weight:  155.5lb Blood Pressure: 118/72 Blood Pressure #2:  116/70  Fasting Labs Drawn Today, will review with patient when they result.  Medical History:  Patient states that she has not been diagnosed with high cholesterol, high blood pressure, diabetes or heart disease.  Medications:  Patients states she is not taking any medications for high cholesterol, high blood pressure or diabetes.  She is not taking aspirin daily to prevent heart attack or stroke.    Blood pressure, self measurement:  Patients states she does not measure blood pressure at home.    Nutrition:  Patient states she eats 2 cups of fruit and 2 cups of vegetables in an average day.  Patient states she does not eat fish regularly, she eats more than half a serving of whole grains daily. She drinks less than 36 ounces of beverages with added sugar weekly.  She is currently watching her sodium intake.  She has not had any drinks containing alcohol in the last seven days.    Physical activity:  Patient states that she gets 0 minutes of moderate exercise in a week.  She gets 0 minutes of vigorous exercise per week.    Smoking status:  Patient states she has never smoked and is not around any smokers.    Quality of life:  Patient states that she has had 0 bad physical days out of the last 30 days. In the last 2 weeks, she has had 0 days that she has felt down or depressed. She has had 0 days in the last 2 weeks that she has had little interest or pleasure in doing things.  Risk reduction and counseling:  Patient states she wants to lose weight and increase fruit and vegetable intake.  I encouraged her to start an  exercise regimen and increase vegetable and fruit intake.  Navigation:  I will notify patient of lab results.  Patient is aware of 2 more health coaching sessions and a follow up.

## 2017-10-04 ENCOUNTER — Telehealth (HOSPITAL_COMMUNITY): Payer: Self-pay | Admitting: *Deleted

## 2017-10-04 ENCOUNTER — Inpatient Hospital Stay: Payer: Self-pay

## 2017-10-04 VITALS — BP 106/69 | HR 61 | Temp 99.1°F | Resp 18

## 2017-10-04 DIAGNOSIS — D500 Iron deficiency anemia secondary to blood loss (chronic): Secondary | ICD-10-CM

## 2017-10-04 MED ORDER — SODIUM CHLORIDE 0.9 % IV SOLN
Freq: Once | INTRAVENOUS | Status: AC
  Administered 2017-10-04: 09:00:00 via INTRAVENOUS
  Filled 2017-10-04: qty 250

## 2017-10-04 MED ORDER — FERUMOXYTOL INJECTION 510 MG/17 ML
510.0000 mg | Freq: Once | INTRAVENOUS | Status: AC
  Administered 2017-10-04: 510 mg via INTRAVENOUS
  Filled 2017-10-04: qty 17

## 2017-10-04 NOTE — Patient Instructions (Signed)
Ferumoxytol injection Qu es este medicamento? El FERUMOXYTOL es un complejo de hierro. El hierro se utiliza para la produccin de glbulos rojos sanos, los cuales transportan el oxgeno y los nutrientes hacia todo el cuerpo. Este medicamento se utiliza para tratar la anemia por falta de hierro a las personas con enfermedad renal crnica. Este medicamento puede ser utilizado para otros usos; si tiene alguna pregunta consulte con su proveedor de atencin mdica o con su farmacutico. MARCAS COMUNES: Feraheme Qu le debo informar a mi profesional de la salud antes de tomar este medicamento? Necesita saber si usted presenta alguno de los siguientes problemas o situaciones: -anemia no provocada por niveles bajos de hierro -niveles altos de hierro en la sangre -examen de imgenes por resonancia magntica (IRM) programado -una reaccin alrgica o inusual al hierro, otros medicamentos, alimentos, colorantes o conservantes -si est embarazada o buscando quedar embarazada -si est amamantando a un beb Cmo debo utilizar este medicamento? Este medicamento se administra mediante inyeccin por va intravenosa. Lo administra un profesional de la salud en un hospital o en un entorno clnico. Hable con su pediatra para informarse acerca del uso de este medicamento en nios. Puede requerir atencin especial. Sobredosis: Pngase en contacto inmediatamente con un centro toxicolgico o una sala de urgencia si usted cree que haya tomado demasiado medicamento. ATENCIN: Este medicamento es solo para usted. No comparta este medicamento con nadie. Qu sucede si me olvido de una dosis? Es importante no olvidar ninguna dosis. Informe a su mdico o a su profesional de la salud si no puede asistir a una cita. Qu puede interactuar con este medicamento? Esta medicina puede interactuar con los siguientes medicamentos: -otros productos de hierro Puede ser que esta lista no menciona todas las posibles interacciones.  Informe a su profesional de la salud de todos los productos a base de hierbas, medicamentos de venta libre o suplementos nutritivos que est tomando. Si usted fuma, consume bebidas alcohlicas o si utiliza drogas ilegales, indqueselo tambin a su profesional de la salud. Algunas sustancias pueden interactuar con su medicamento. A qu debo estar atento al usar este medicamento? Visite a su mdico o a su profesional de la salud de manera regular. Si los sntomas no comienzan a mejorar o si empeoran, consulte con su mdico o con su profesional de la salud. Tal vez necesita realizarse anlisis de sangre mientras recibe este medicamento. Tal vez necesita seguir una dieta especial. Consulte a su mdico. Los alimentos que contienen hierro incluyen: alimentos integrales o con cereales, frutas secas, frijoles o arvejas, vegetales de hoja verde y carne que proviene de rganos (hgado, rin). Qu efectos secundarios puedo tener al utilizar este medicamento? Efectos secundarios que debe informar a su mdico o a su profesional de la salud tan pronto como sea posible: reacciones alrgicas, como erupcin cutnea, picazn o urticarias, e hinchazn de la cara, los labios o la lengua problemas respiratorios cambios en la presin sangunea sensacin de desmayos o aturdimiento, cadas fiebre o escalofros enrojecimiento, sudoracin o sensacin de calor hinchazn de tobillos o pies Efectos secundarios que generalmente no requieren atencin mdica (infrmelos a su mdico o a su profesional de la salud si persisten o si son molestos): diarrea dolor de cabeza nuseas, vmito dolor estomacal Puede ser que esta lista no menciona todos los posibles efectos secundarios. Comunquese a su mdico por asesoramiento mdico sobre los efectos secundarios. Usted puede informar los efectos secundarios a la FDA por telfono al 1-800-FDA-1088. Dnde debo guardar mi medicina? Este medicamento se administra en hospitales   o clnicas y no  necesitar guardarlo en su domicilio. ATENCIN: Este folleto es un resumen. Puede ser que no cubra toda la posible informacin. Si usted tiene preguntas acerca de esta medicina, consulte con su mdico, su farmacutico o su profesional de la salud.  2018 Elsevier/Gold Standard (2015-04-15 00:00:00)  

## 2017-10-04 NOTE — Telephone Encounter (Signed)
Health coaching  2   Spanish interpreter- Almyra Free Sowell  Labs- LDL cholesterol 128, cholesterol 195, triglycerides 98, HDL cholesterol 47, hemoglobin A1C 7.1, mean plasma glucose 157.07  Patient is aware and understands her lab results.   Goals- Patients she has made several changes to her diet.  She states she is eating vegetable/fruit smoothies and is walking 30 minutes daily. I encouraged patient to exercise at least 150 minters weekly and to eat lean meats and eat more fish such as salmon, tuna and mackerel.   Navigation: Patient is aware of 1 more health coaching sessions and a follow up.  Patient is with [facility] and has an appointment with them to follow up on her A1C.  Time- 10 minutes

## 2017-11-05 ENCOUNTER — Inpatient Hospital Stay: Payer: Self-pay | Attending: Hematology and Oncology

## 2017-11-05 ENCOUNTER — Other Ambulatory Visit: Payer: Self-pay | Admitting: Hematology

## 2017-11-05 DIAGNOSIS — N920 Excessive and frequent menstruation with regular cycle: Secondary | ICD-10-CM | POA: Insufficient documentation

## 2017-11-05 DIAGNOSIS — D500 Iron deficiency anemia secondary to blood loss (chronic): Secondary | ICD-10-CM

## 2017-11-05 DIAGNOSIS — E039 Hypothyroidism, unspecified: Secondary | ICD-10-CM | POA: Insufficient documentation

## 2017-11-05 LAB — CBC WITH DIFFERENTIAL (CANCER CENTER ONLY)
ABS IMMATURE GRANULOCYTES: 0.02 10*3/uL (ref 0.00–0.07)
BASOS PCT: 0 %
Basophils Absolute: 0 10*3/uL (ref 0.0–0.1)
EOS ABS: 0.1 10*3/uL (ref 0.0–0.5)
Eosinophils Relative: 2 %
HCT: 37.6 % (ref 36.0–46.0)
Hemoglobin: 12.4 g/dL (ref 12.0–15.0)
IMMATURE GRANULOCYTES: 0 %
Lymphocytes Relative: 30 %
Lymphs Abs: 2.4 10*3/uL (ref 0.7–4.0)
MCH: 30.8 pg (ref 26.0–34.0)
MCHC: 33 g/dL (ref 30.0–36.0)
MCV: 93.3 fL (ref 80.0–100.0)
MONOS PCT: 10 %
Monocytes Absolute: 0.8 10*3/uL (ref 0.1–1.0)
NEUTROS ABS: 4.5 10*3/uL (ref 1.7–7.7)
NEUTROS PCT: 58 %
PLATELETS: 211 10*3/uL (ref 150–400)
RBC: 4.03 MIL/uL (ref 3.87–5.11)
RDW: 14 % (ref 11.5–15.5)
WBC: 7.8 10*3/uL (ref 4.0–10.5)
nRBC: 0 % (ref 0.0–0.2)

## 2017-11-08 LAB — IRON AND TIBC
IRON: 48 ug/dL (ref 41–142)
Saturation Ratios: 16 % — ABNORMAL LOW (ref 21–57)
TIBC: 296 ug/dL (ref 236–444)
UIBC: 248 ug/dL

## 2017-11-08 LAB — FERRITIN: FERRITIN: 145 ng/mL (ref 11–307)

## 2017-11-26 ENCOUNTER — Ambulatory Visit: Payer: Self-pay | Attending: Nurse Practitioner | Admitting: Nurse Practitioner

## 2017-11-26 ENCOUNTER — Encounter: Payer: Self-pay | Admitting: Nurse Practitioner

## 2017-11-26 VITALS — BP 108/70 | HR 71 | Temp 99.1°F | Ht 60.0 in | Wt 146.8 lb

## 2017-11-26 DIAGNOSIS — Z79890 Hormone replacement therapy (postmenopausal): Secondary | ICD-10-CM | POA: Insufficient documentation

## 2017-11-26 DIAGNOSIS — Z7984 Long term (current) use of oral hypoglycemic drugs: Secondary | ICD-10-CM | POA: Insufficient documentation

## 2017-11-26 DIAGNOSIS — E119 Type 2 diabetes mellitus without complications: Secondary | ICD-10-CM | POA: Insufficient documentation

## 2017-11-26 DIAGNOSIS — D509 Iron deficiency anemia, unspecified: Secondary | ICD-10-CM | POA: Insufficient documentation

## 2017-11-26 DIAGNOSIS — Z79899 Other long term (current) drug therapy: Secondary | ICD-10-CM | POA: Insufficient documentation

## 2017-11-26 DIAGNOSIS — E039 Hypothyroidism, unspecified: Secondary | ICD-10-CM | POA: Insufficient documentation

## 2017-11-26 LAB — GLUCOSE, POCT (MANUAL RESULT ENTRY): POC Glucose: 98 mg/dl (ref 70–99)

## 2017-11-26 MED ORDER — METFORMIN HCL 500 MG PO TABS: 500.0000 mg | ORAL_TABLET | Freq: Two times a day (BID) | ORAL | 3 refills | Status: DC

## 2017-11-26 MED ORDER — TRUE METRIX METER W/DEVICE KIT: PACK | 0 refills | Status: DC

## 2017-11-26 MED ORDER — TRUEPLUS LANCETS 28G MISC: 3 refills | Status: DC

## 2017-11-26 MED ORDER — FERROUS SULFATE 325 (65 FE) MG PO TABS: 325.0000 mg | ORAL_TABLET | Freq: Three times a day (TID) | ORAL | 3 refills | Status: DC

## 2017-11-26 MED ORDER — GLUCOSE BLOOD VI STRP: ORAL_STRIP | 12 refills | Status: DC

## 2017-11-26 MED FILL — !TRUE METRIX BLOOD GLUCOSE: 365 days supply | Qty: 1 | Fill #0

## 2017-11-26 MED FILL — TRUE METRIX TEST STRIP: 50 days supply | Qty: 50 | Fill #0

## 2017-11-26 MED FILL — TRUEplus LANCETS 28G MISC: 90 days supply | Qty: 100 | Fill #0

## 2017-11-26 MED FILL — metFORMIN HCL 500 MG TABS: 500 | 30 days supply | Qty: 60 | Fill #0

## 2017-11-26 NOTE — Progress Notes (Signed)
Assessment & Plan:  Amy Maldonado was seen today for follow-up.  Diagnoses and all orders for this visit:  Hypothyroidism, unspecified type -     TSH  Controlled type 2 diabetes mellitus without complication, without long-term current use of insulin (HCC) -     Glucose (CBG) -     Blood Glucose Monitoring Suppl (TRUE METRIX METER) w/Device KIT; Use as instructed. -     glucose blood (TRUE METRIX BLOOD GLUCOSE TEST) test strip; Use as instructed. Monitor blood glucose levels once a day by fingerstick. -     TRUEPLUS LANCETS 28G MISC; Use as instructed. Monitor blood glucose levels once a day by fingerstick. -     metFORMIN (GLUCOPHAGE) 500 MG tablet; Take 1 tablet (500 mg total) by mouth 2 (two) times daily with a meal. -     Microalbumin/Creatinine Ratio, Urine  Iron deficiency anemia, unspecified iron deficiency anemia type -     ferrous sulfate 325 (65 FE) MG tablet; Take 1 tablet (325 mg total) by mouth 3 (three) times daily with meals.    Patient has been counseled on age-appropriate routine health concerns for screening and prevention. These are reviewed and up-to-date. Referrals have been placed accordingly. Immunizations are up-to-date or declined.    Subjective:   Chief Complaint  Patient presents with  . Follow-up    Pt. is here to follow-up thyroid and her new onset of diabetes.    HPI Amy Maldonado 42 y.o. female presents to office today for hypothyroidism. 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  VRI was used to communicate directly with patient for the entire encounter including providing detailed patient instructions.    Diabetes Mellitus Type 2 New onset. Well controlled with glucose in office around 90s today postprandial. She has lost almost 10lbs since September. She endorses making significant dietary changes after being diagnosed with Diabetes. Her goal is to  manage her diabetes with diet and exercise. I will start her on metformin 530m BID today. She has been instructed to taper herself down to once a day and then off if she begins to have frequent low readings or episodes of hypoglycemia. We had a long discussion today regarding dietary modifications in regards to carb intake and simple sugars. She was instructed to begin monitoring her glucose levels 1-2 times per day.  Patient has been advised to apply for financial assistance and schedule to see our financial counselor.  Lab Results  Component Value Date   HGBA1C 7.1 (H) 10/01/2017      Hypothyroidism TSH levels have been stable on synthroid 259mdaily. She endorses medication compliance. She denies fatigue, unintentional weight changes, heat/cold intolerance, bowel/skin changes or CVS symptoms. Repeat TSH pending.  Lab Results  Component Value Date   TSH 3.650 07/09/2017    Review of Systems  Constitutional: Negative for fever, malaise/fatigue and weight loss.  HENT: Negative.  Negative for nosebleeds.   Eyes: Negative.  Negative for blurred vision, double vision and photophobia.  Respiratory: Negative.  Negative for cough and shortness of breath.   Cardiovascular: Negative.  Negative for chest pain, palpitations and leg swelling.  Gastrointestinal: Negative.  Negative for heartburn, nausea and vomiting.  Musculoskeletal: Negative.  Negative for myalgias.  Neurological: Negative.  Negative for dizziness, focal weakness, seizures and headaches.  Psychiatric/Behavioral: Negative.  Negative for suicidal ideas.    Past Medical History:  Diagnosis Date  .  Anemia   . Hypothyroidism 2011    Past Surgical History:  Procedure Laterality Date  . CESAREAN SECTION     3 c section    Family History  Problem Relation Age of Onset  . Hypertension Mother   . Diabetes Father   . Hypertension Father   . Cancer Father        prostate cancer    Social History Reviewed with no changes to  be made today.   Outpatient Medications Prior to Visit  Medication Sig Dispense Refill  . levothyroxine (SYNTHROID, LEVOTHROID) 25 MCG tablet TAKE 1 TABLET BY MOUTH DAILY BEFORE BREAKFAST 90 tablet 1  . ferrous sulfate 325 (65 FE) MG tablet Take 1 tablet (325 mg total) by mouth 3 (three) times daily with meals. 180 tablet 3   No facility-administered medications prior to visit.     No Known Allergies     Objective:    BP 108/70 (BP Location: Left Arm, Patient Position: Sitting, Cuff Size: Normal)   Pulse 71   Temp 99.1 F (37.3 C) (Oral)   Ht 5' (1.524 m)   Wt 146 lb 12.8 oz (66.6 kg)   LMP 11/08/2017   SpO2 100%   BMI 28.67 kg/m  Wt Readings from Last 3 Encounters:  11/26/17 146 lb 12.8 oz (66.6 kg)  10/01/17 155 lb 8 oz (70.5 kg)  09/24/17 161 lb 12.8 oz (73.4 kg)    Physical Exam  Constitutional: She is oriented to person, place, and time. She appears well-developed and well-nourished. She is cooperative.  HENT:  Head: Normocephalic and atraumatic.  Eyes: EOM are normal.  Neck: Normal range of motion.  Cardiovascular: Normal rate, regular rhythm, normal heart sounds and intact distal pulses. Exam reveals no gallop and no friction rub.  No murmur heard. Pulmonary/Chest: Effort normal and breath sounds normal. No tachypnea. No respiratory distress. She has no decreased breath sounds. She has no wheezes. She has no rhonchi. She has no rales. She exhibits no tenderness.  Abdominal: Soft. Bowel sounds are normal.  Musculoskeletal: Normal range of motion. She exhibits no edema.  Neurological: She is alert and oriented to person, place, and time. Coordination normal.  Skin: Skin is warm and dry.  Psychiatric: She has a normal mood and affect. Her behavior is normal. Judgment and thought content normal.  Nursing note and vitals reviewed.        Patient has been counseled extensively about nutrition and exercise as well as the importance of adherence with medications and  regular follow-up. The patient was given clear instructions to go to ER or return to medical center if symptoms don't improve, worsen or new problems develop. The patient verbalized understanding.   Follow-up: Return in about 3 months (around 02/26/2018) for DM, Fasting labs/lipids.   Gildardo Pounds, FNP-BC [facility] and [facility], [place]   11/26/2017, 11:35 PM

## 2017-11-26 NOTE — Patient Instructions (Addendum)
Diabetes blood sugar goals  Fasting in AM before breakfast which means at least 8 hrs of no eating or drinking) except water or unsweetened coffee or tea): Your readings on your meter should between 90-130. 1-2 hrs after a meal: Your readings on your meter should be less than 180,   Hypoglycemia or low blood sugar: < 70 (You should not have hypoglycemia.) If your blood glucose levels are below 90 in the mornings for several mornings then I want you to go back to taking metformin only once a day.   Aim for 30 minutes of exercise most days. Rethink what you drink. Water is great! Aim for 2-3 Carb Choices per meal (30-45 grams) +/- 1 either way  Aim for 0-15 Carbs per snack if hungry  Include protein in moderation with your meals and snacks  Consider reading food labels for Total Carbohydrate and Fat Grams of foods   Colesterol Cholesterol El colesterol es grasa. El organismo necesita una pequea cantidad de colesterol. El colesterol (placa) puede acumularse en los vasos sanguneos (arterias). Esto lo hace ms propenso a sufrir infartos de miocardio o accidentes cerebrovasculares. No puede sentir el nivel de colesterol. La nica forma de saber si el nivel es alto es mediante un anlisis de [place]. Guarde los [employer]. Trabaje con el mdico para Investment banker, corporate en un nivel adecuado. [place] significan los resultados?  El colesterol total es la cantidad de colesterol que hay en la [place].  El LDL es el colesterol [place]. Este tipo es el que puede acumularse. Intente mantener el nivel de LDL bajo.  El HDL es el colesterol bueno. Limpia los vasos sanguneos y elimina el colesterol LDL. Intente mantener el nivel de HDL alto.  Los triglicridos son grasas que el organismo puede Financial controller o quemar como fuente de Teacher, early years/pre. Cules son los niveles buenos de colesterol?  El colesterol total debe estar por debajo de 200.  Lo ms aconsejable para quienes tienen riesgos para la  salud es mantener el nivel de LDL por debajo de 100. Lo ms aconsejable para quienes tienen muchos riesgos para la salud es mantener el nivel de LDL por debajo de 70.  Se aconseja mantener un nivel de HDL es por encima de 40. Pero lo mejor es tener un nivel de HDL de 60 o superior.  Los triglicridos por debajo de 150. Cmo puedo bajar el colesterol? Dieta Siga el programa de alimentacin que el mdico le indique.  Elija pescados o carne blanca de pollo y pavo asados u horneados. Intente no comer carne roja, alimentos fritos, salchichas ni embutidos.  Coma gran cantidad de frutas y verduras frescas.  Elija los cereales integrales, los frijoles, las pastas, las papas y los cereales.  Elija aceite de oliva, aceite de maz o aceite de canola. Solo use poca cantidad.  Intente no comer [place], [place], [place] o aceites de [place].  Intente no comer alimentos que contengan grasas trans.  Elija alimentos lcteos descremados o sin grasa. ? Beba leche descremada o sin grasa. ? Coma yogur y quesos descremados o sin grasa. ? Intente no consumir leche entera o crema. ? Intente no comer helados, yemas de huevo ni quesos enteros.  Los postres sanos incluyen la torta ngel, los bocadillos de [place], las Administrator con forma de [place], los caramelos duros, los helados de agua y el yogur descremado o sin grasa. Intente no comer masas, tortas, pasteles y galletas.  La prctica de actividad fsica. Siga el programa de actividad fsica que le  indique el mdico.  Sea ms activo. Intente hacer los trabajos de [place], salir a Writer y usar las escaleras.  Consulte al mdico cmo puede aumentar su actividad.  Medicamentos  [employer] de venta libre y los recetados solamente como se lo haya indicado el mdico.  Esta informacin no tiene Marine scientist el consejo del mdico. Asegrese de hacerle al mdico cualquier pregunta que tenga. Document Released: 01/31/2010  Document Revised: 03/30/2016 Document Reviewed: 07/11/2015 Elsevier Interactive Patient Education  [employer].

## 2017-11-27 LAB — MICROALBUMIN / CREATININE URINE RATIO
CREATININE, UR: 15.6 mg/dL
Microalbumin, Urine: <3 ug/mL

## 2017-11-27 LAB — TSH: TSH: 4.1 u[IU]/mL (ref 0.450–4.500)

## 2017-11-29 ENCOUNTER — Other Ambulatory Visit: Payer: Self-pay | Admitting: Nurse Practitioner

## 2017-11-29 DIAGNOSIS — E039 Hypothyroidism, unspecified: Secondary | ICD-10-CM

## 2017-11-29 MED ORDER — LEVOTHYROXINE SODIUM 25 MCG PO TABS: ORAL_TABLET | ORAL | 1 refills | Status: DC

## 2017-12-01 ENCOUNTER — Telehealth: Payer: Self-pay | Admitting: Nurse Practitioner

## 2017-12-01 NOTE — Telephone Encounter (Signed)
-----   Message from Gildardo Pounds, NP sent at 11/29/2017  8:54 PM EST ----- Thyroid level is normal. Will continue on 70mcg of synthroid. Urine is negative for any diabetic kidney damage

## 2017-12-01 NOTE — Telephone Encounter (Signed)
CMA spoke to patient with assist with front desk receptionist to inform patient on lab results and Rx.  Pt. Understood. Pt. Verified DOB.

## 2017-12-01 NOTE — Telephone Encounter (Signed)
Patient called to get her results and to check on her thyroid medication. CMA was present and results were given. Please follow up with patient.

## 2017-12-06 ENCOUNTER — Ambulatory Visit: Payer: Self-pay | Attending: Nurse Practitioner

## 2017-12-07 ENCOUNTER — Encounter (HOSPITAL_COMMUNITY): Payer: Self-pay

## 2017-12-07 ENCOUNTER — Ambulatory Visit
Admission: RE | Admit: 2017-12-07 | Discharge: 2017-12-07 | Disposition: A | Discharge status: Home / Self Care | Payer: Self-pay | Source: Ambulatory Visit | Attending: Obstetrics and Gynecology | Admitting: Obstetrics and Gynecology

## 2017-12-07 ENCOUNTER — Ambulatory Visit (HOSPITAL_COMMUNITY)
Admission: RE | Admit: 2017-12-07 | Discharge: 2017-12-07 | Disposition: A | Discharge status: Home / Self Care | Payer: Self-pay | Source: Ambulatory Visit | Attending: Obstetrics and Gynecology | Admitting: Obstetrics and Gynecology

## 2017-12-07 VITALS — BP 110/64 | Wt 149.0 lb

## 2017-12-07 DIAGNOSIS — Z1239 Encounter for other screening for malignant neoplasm of breast: Secondary | ICD-10-CM

## 2017-12-07 DIAGNOSIS — Z1231 Encounter for screening mammogram for malignant neoplasm of breast: Secondary | ICD-10-CM

## 2017-12-07 HISTORY — DX: Type 2 diabetes mellitus without complications: E11.9

## 2017-12-07 NOTE — Patient Instructions (Signed)
Explained breast self awareness [name]. Patient did not need a Pap smear today due to last Pap smear and HPV typing was 02/15/2017. Let her know BCCCP will cover Pap smears and HPV typing every 5 years unless has a history of abnormal Pap smears. Referred patient to the [facility] for a screening mammogram. Appointment scheduled for Tuesday, December 07, 2017 at 1630.    Patient aware of appointment and will be there. Let patient know the [facility] will follow up with her within the next couple weeks with results of mammogram by letter or phone. [place] verbalized understanding.  Tyana Butzer, Arvil Chaco, RN 3:36 PM

## 2017-12-07 NOTE — Progress Notes (Signed)
No complaints today.   Pap Smear: Pap smear not completed today. Last Pap smear was 02/15/2017 at the [facility] and normal with negative HPV. Per patient has no history of an abnormal Pap smear. Last Pap smear result is in Epic.  Physical exam: Breasts Breasts symmetrical. No skin abnormalities bilateral breasts. No nipple retraction bilateral breasts. No nipple discharge bilateral breasts. No lymphadenopathy. No lumps palpated bilateral breasts. No complaints of pain or tenderness on exam. Referred patient to the [facility] for a screening mammogram. Appointment scheduled for Tuesday, December 07, 2017 at 1630.        Pelvic/Bimanual No Pap smear completed today since last Pap smear and HPV typing was 02/15/2017. Pap smear not indicated per BCCCP guidelines.   Smoking History: Patient has never smoked.  Patient Navigation: Patient education provided. Access to services provided for patient through [facility] program. Spanish interpreter provided.   Breast and Cervical Cancer Risk Assessment: Patient has no family history of breast cancer, known genetic mutations, or radiation treatment to the chest before age 16. Patient has no history of cervical dysplasia, immunocompromised, or DES exposure in-utero.  Risk Assessment    Risk Scores      12/07/2017   Last edited by: Armond Hang, LPN   5-year risk: 0.4 %   Lifetime risk: 6.3 %         Used Spanish interpreter Rudene Anda from [place].

## 2017-12-08 ENCOUNTER — Telehealth (HOSPITAL_COMMUNITY): Payer: Self-pay | Admitting: *Deleted

## 2017-12-08 NOTE — Telephone Encounter (Signed)
Health Coaching 3   Spanish interpreter- Lavon Paganini   Goals- Patient states that she has lost 10 lbs.  Patient states that she is making smoothies with cactus, spinach and celery, cutting carbohydrates and eating more fruits and vegetables. Patient states that she walks 30 minutes every day. I encouraged patient to continue with her exercise regimen and new health regimen.  Barrier to reaching goal- N/A  Strategies to overcome barriers- N/A  Navigation: Patient is aware of a follow up.  Time- 15 minutes

## 2017-12-13 ENCOUNTER — Other Ambulatory Visit: Payer: Self-pay | Admitting: Obstetrics and Gynecology

## 2017-12-13 ENCOUNTER — Encounter (HOSPITAL_COMMUNITY): Payer: Self-pay | Admitting: *Deleted

## 2017-12-13 DIAGNOSIS — R928 Other abnormal and inconclusive findings on diagnostic imaging of breast: Secondary | ICD-10-CM

## 2017-12-17 ENCOUNTER — Inpatient Hospital Stay: Payer: Self-pay | Attending: Hematology and Oncology

## 2017-12-17 DIAGNOSIS — N920 Excessive and frequent menstruation with regular cycle: Secondary | ICD-10-CM | POA: Insufficient documentation

## 2017-12-17 DIAGNOSIS — D500 Iron deficiency anemia secondary to blood loss (chronic): Secondary | ICD-10-CM | POA: Insufficient documentation

## 2017-12-17 LAB — CBC WITH DIFFERENTIAL (CANCER CENTER ONLY)
Abs Immature Granulocytes: 0.04 10*3/uL (ref 0.00–0.07)
BASOS ABS: 0 10*3/uL (ref 0.0–0.1)
Basophils Relative: 0 %
EOS PCT: 1 %
Eosinophils Absolute: 0.1 10*3/uL (ref 0.0–0.5)
HCT: 34.8 % — ABNORMAL LOW (ref 36.0–46.0)
HEMOGLOBIN: 11.3 g/dL — AB (ref 12.0–15.0)
Immature Granulocytes: 1 %
LYMPHS PCT: 21 %
Lymphs Abs: 1.8 10*3/uL (ref 0.7–4.0)
MCH: 31.1 pg (ref 26.0–34.0)
MCHC: 32.5 g/dL (ref 30.0–36.0)
MCV: 95.9 fL (ref 80.0–100.0)
Monocytes Absolute: 0.7 10*3/uL (ref 0.1–1.0)
Monocytes Relative: 8 %
NEUTROS PCT: 69 %
NRBC: 0 % (ref 0.0–0.2)
Neutro Abs: 5.9 10*3/uL (ref 1.7–7.7)
Platelet Count: 281 10*3/uL (ref 150–400)
RBC: 3.63 MIL/uL — AB (ref 3.87–5.11)
RDW: 14 % (ref 11.5–15.5)
WBC: 8.6 10*3/uL (ref 4.0–10.5)

## 2017-12-20 ENCOUNTER — Ambulatory Visit
Admission: RE | Admit: 2017-12-20 | Discharge: 2017-12-20 | Disposition: A | Discharge status: Home / Self Care | Payer: PRIVATE HEALTH INSURANCE | Source: Ambulatory Visit | Attending: Obstetrics and Gynecology | Admitting: Obstetrics and Gynecology

## 2017-12-20 ENCOUNTER — Ambulatory Visit
Admission: RE | Admit: 2017-12-20 | Discharge: 2017-12-20 | Disposition: A | Discharge status: Home / Self Care | Payer: Self-pay | Source: Ambulatory Visit | Attending: Obstetrics and Gynecology | Admitting: Obstetrics and Gynecology

## 2017-12-20 DIAGNOSIS — R928 Other abnormal and inconclusive findings on diagnostic imaging of breast: Secondary | ICD-10-CM

## 2017-12-20 LAB — IRON AND TIBC
IRON: 20 ug/dL — AB (ref 41–142)
Saturation Ratios: 6 % — ABNORMAL LOW (ref 21–57)
TIBC: 316 ug/dL (ref 236–444)
UIBC: 296 ug/dL (ref 120–384)

## 2017-12-20 LAB — FERRITIN: FERRITIN: 62 ng/mL (ref 11–307)

## 2017-12-22 NOTE — Progress Notes (Signed)
[facility]   [phone] [phone]   Clinic Follow up Note   Patient Care Team: Gildardo Pounds, NP as PCP - General (Nurse Practitioner)  Date of Service:  12/24/2017  CHIEF COMPLAINT: F/u anemia    CURRENT THERAPY:  Continue ferrous sulfate 325 mg oral 3 times a day.  IV Feriheme as needed   INTERVAL HISTORY:  Amy Maldonado is here for a follow up of anemia and IV Iron. She presents to the clinic today accompanied by her translator. She notes after her first dose IV iron she felt better. He energy level lately is well. With lower blood counts previously she felt much weaker. She notes her period this month has not been heavy, in the past she has experienced 3 days heavy periods. Her periods are very regular. She takes oral iron with no issues or constipation.    REVIEW OF SYSTEMS:   Constitutional: Denies fevers, chills or abnormal weight loss (+) improved, adequate energy.  Eyes: Denies blurriness of vision Ears, nose, mouth, throat, and face: Denies mucositis or sore throat Respiratory: Denies cough, dyspnea or wheezes Cardiovascular: Denies palpitation, chest discomfort or lower extremity swelling Gastrointestinal:  Denies nausea, heartburn or change in bowel habits Skin: Denies abnormal skin rashes Lymphatics: Denies new lymphadenopathy or easy bruising Neurological:Denies numbness, tingling or new weaknesses Behavioral/Psych: Mood is stable, no new changes  All other systems were reviewed with the patient and are negative.  MEDICAL HISTORY:  Past Medical History:  Diagnosis Date  . Anemia   . Diabetes mellitus without complication ([place])   . Hypothyroidism 2011    SURGICAL HISTORY: Past Surgical History:  Procedure Laterality Date  . CESAREAN SECTION     3 c section    I have reviewed the social history and family history with the patient and they are unchanged from previous note.  ALLERGIES:  has No Known  Allergies.  MEDICATIONS:  Current Outpatient Medications  Medication Sig Dispense Refill  . Blood Glucose Monitoring Suppl (TRUE METRIX METER) w/Device KIT Use as instructed. 1 kit 0  . ferrous sulfate 325 (65 FE) MG tablet Take 1 tablet (325 mg total) by mouth 3 (three) times daily with meals. 180 tablet 3  . glucose blood (TRUE METRIX BLOOD GLUCOSE TEST) test strip Use as instructed. Monitor blood glucose levels once a day by fingerstick. 100 each 12  . levothyroxine (SYNTHROID, LEVOTHROID) 25 MCG tablet TAKE 1 TABLET BY MOUTH DAILY BEFORE BREAKFAST 90 tablet 1  . metFORMIN (GLUCOPHAGE) 500 MG tablet Take 1 tablet (500 mg total) by mouth 2 (two) times daily with a meal. 60 tablet 3  . TRUEPLUS LANCETS 28G MISC Use as instructed. Monitor blood glucose levels once a day by fingerstick. 100 each 3   No current facility-administered medications for this visit.     PHYSICAL EXAMINATION: ECOG PERFORMANCE STATUS: 0 - Asymptomatic  Vitals:   12/24/17 1353  BP: 110/67  Pulse: 78  Resp: 18  Temp: 98.2 F (36.8 C)  SpO2: 100%   Filed Weights   12/24/17 1353  Weight: 144 lb 4.8 oz (65.5 kg)    GENERAL:alert, no distress and comfortable SKIN: skin color, texture, turgor are normal, no rashes or significant lesions EYES: normal, Conjunctiva are pink and non-injected, sclera clear OROPHARYNX:no exudate, no erythema and lips, buccal mucosa, and tongue normal  NECK: supple, thyroid normal size, non-tender, without nodularity LYMPH:  no palpable lymphadenopathy in the cervical, axillary or inguinal LUNGS: clear to auscultation  and percussion with normal breathing effort HEART: regular rate & rhythm and no murmurs and no lower extremity edema ABDOMEN:abdomen soft, non-tender and normal bowel sounds Musculoskeletal:no cyanosis of digits and no clubbing  NEURO: alert & oriented x 3 with fluent speech, no focal motor/sensory deficits  LABORATORY DATA:  I have reviewed the data as listed CBC  Latest Ref Rng & Units 12/17/2017 11/05/2017 09/20/2017  WBC 4.0 - 10.5 K/uL 8.6 7.8 7.9  Hemoglobin 12.0 - 15.0 g/dL 11.3(L) 12.4 10.0(L)  Hematocrit 36.0 - 46.0 % 34.8(L) 37.6 30.6(L)  Platelets 150 - 400 K/uL 281 211 250     CMP Latest Ref Rng & Units 09/20/2017 10/10/2015 09/06/2012  Glucose 70 - 99 mg/dL 225(H) 92 82  BUN 6 - 20 mg/dL 14 13 10   Creatinine 0.44 - 1.00 mg/dL 1.10(H) 0.60 0.53  Sodium 135 - 145 mmol/L 137 138 140  Potassium 3.5 - 5.1 mmol/L 4.1 4.1 4.0  Chloride 98 - 111 mmol/L 105 106 104  CO2 22 - 32 mmol/L 24 22 27   Calcium 8.9 - 10.3 mg/dL 8.8(L) 9.2 9.4  Total Protein 6.5 - 8.1 g/dL 6.8 - 7.7  Total Bilirubin 0.3 - 1.2 mg/dL <0.2(L) - 0.3  Alkaline Phos 38 - 126 U/L 79 - 53  AST 15 - 41 U/L 24 - 19  ALT 0 - 44 U/L 45(H) - 23      RADIOGRAPHIC STUDIES: I have personally reviewed the radiological images as listed and agreed with the findings in the report. No results found.   ASSESSMENT & PLAN:  Amy Maldonado is a 42 y.o. female with   1.  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  Last dose IV Feraheme in September 2019.  She responded well, anemia resolved. -We will continue monitoring closely.  She continues to have monthly periods with Menorrhagia the first few days.  -I recommend she take her oral iron with Vitamin C or orange juice -lab reviewed. Due to recently low iron level and mild anemia, she will proceed with IV iron today.  -F/u in 6 months   2. Hypothyroidism -She's been on 25 mg Synthroid  -She is complaint and doing well.  3. Diabetes -Pt notes she was diagnosed with DM. Her last HbA1c was 7.1 in 09/2017.  -She is currently on Metformin.  -She will continue to follow up with her PCP.   4. Cancer screening  -Continue age appropriate cancer screenings   Plan -Continue oral iron TID -Proceed with IV Feriheme today  -Lab every 2 monthsX3    -We will set up IV Feraheme if ferritin less than 50, or low iron level or mild anemia -f/u in 6 months      No problem-specific Assessment & Plan notes found for this encounter.   No orders of the defined types were placed in this encounter.  All questions were answered. The patient knows to call the clinic with any problems, questions or concerns. No barriers to learning was detected. I spent 15 minutes counseling the patient face to face. The total time spent in the appointment was 20 minutes and more than 50% was on counseling and review of test results     Truitt Merle, MD 12/24/2017   I, Joslyn Devon, am acting as scribe for Truitt Merle, MD.   I have reviewed the above documentation for accuracy and completeness, and I agree with the above.

## 2017-12-23 ENCOUNTER — Telehealth: Payer: Self-pay | Admitting: Nurse Practitioner

## 2017-12-23 NOTE — Telephone Encounter (Signed)
Patient called to check on the status of her cafa letter Please follow up.

## 2017-12-23 NOTE — Telephone Encounter (Signed)
Pt was inform that the letter for CAFA will be mail

## 2017-12-24 ENCOUNTER — Inpatient Hospital Stay (HOSPITAL_BASED_OUTPATIENT_CLINIC_OR_DEPARTMENT_OTHER): Payer: Self-pay | Admitting: Hematology

## 2017-12-24 ENCOUNTER — Inpatient Hospital Stay: Payer: Self-pay

## 2017-12-24 ENCOUNTER — Encounter: Payer: Self-pay | Admitting: Hematology

## 2017-12-24 ENCOUNTER — Telehealth: Payer: Self-pay | Admitting: Nurse Practitioner

## 2017-12-24 ENCOUNTER — Telehealth: Payer: Self-pay

## 2017-12-24 VITALS — BP 110/67 | HR 78 | Temp 98.2°F | Resp 18 | Ht 60.0 in | Wt 144.3 lb

## 2017-12-24 VITALS — BP 100/67 | HR 60 | Temp 98.9°F | Resp 18

## 2017-12-24 DIAGNOSIS — E119 Type 2 diabetes mellitus without complications: Secondary | ICD-10-CM

## 2017-12-24 DIAGNOSIS — E039 Hypothyroidism, unspecified: Secondary | ICD-10-CM

## 2017-12-24 DIAGNOSIS — N920 Excessive and frequent menstruation with regular cycle: Secondary | ICD-10-CM

## 2017-12-24 DIAGNOSIS — D500 Iron deficiency anemia secondary to blood loss (chronic): Secondary | ICD-10-CM

## 2017-12-24 MED ORDER — SODIUM CHLORIDE 0.9 % IV SOLN
Freq: Once | INTRAVENOUS | Status: AC
  Administered 2017-12-24: 15:00:00 via INTRAVENOUS
  Filled 2017-12-24: qty 250

## 2017-12-24 MED ORDER — SODIUM CHLORIDE 0.9 % IV SOLN
510.0000 mg | Freq: Once | INTRAVENOUS | Status: AC
  Administered 2017-12-24: 510 mg via INTRAVENOUS
  Filled 2017-12-24: qty 17

## 2017-12-24 NOTE — Telephone Encounter (Signed)
Printed avs and calender of upcoming appointment. Per 12/13 los  

## 2017-12-24 NOTE — Patient Instructions (Signed)

## 2017-12-25 ENCOUNTER — Encounter: Payer: Self-pay | Admitting: Hematology

## 2018-02-18 ENCOUNTER — Inpatient Hospital Stay: Payer: Self-pay | Attending: Hematology and Oncology

## 2018-02-18 DIAGNOSIS — D500 Iron deficiency anemia secondary to blood loss (chronic): Secondary | ICD-10-CM | POA: Insufficient documentation

## 2018-02-18 DIAGNOSIS — N920 Excessive and frequent menstruation with regular cycle: Secondary | ICD-10-CM | POA: Insufficient documentation

## 2018-02-18 LAB — CBC WITH DIFFERENTIAL (CANCER CENTER ONLY)
Abs Immature Granulocytes: 0.01 10*3/uL (ref 0.00–0.07)
BASOS ABS: 0 10*3/uL (ref 0.0–0.1)
BASOS PCT: 0 %
EOS ABS: 0 10*3/uL (ref 0.0–0.5)
Eosinophils Relative: 1 %
HCT: 41.2 % (ref 36.0–46.0)
Hemoglobin: 13.6 g/dL (ref 12.0–15.0)
IMMATURE GRANULOCYTES: 0 %
Lymphocytes Relative: 21 %
Lymphs Abs: 1.5 10*3/uL (ref 0.7–4.0)
MCH: 31.2 pg (ref 26.0–34.0)
MCHC: 33 g/dL (ref 30.0–36.0)
MCV: 94.5 fL (ref 80.0–100.0)
MONOS PCT: 9 %
Monocytes Absolute: 0.7 10*3/uL (ref 0.1–1.0)
NEUTROS ABS: 5 10*3/uL (ref 1.7–7.7)
NEUTROS PCT: 69 %
NRBC: 0 % (ref 0.0–0.2)
PLATELETS: 258 10*3/uL (ref 150–400)
RBC: 4.36 MIL/uL (ref 3.87–5.11)
RDW: 13.4 % (ref 11.5–15.5)
WBC Count: 7.3 10*3/uL (ref 4.0–10.5)

## 2018-02-21 LAB — IRON AND TIBC
Iron: 31 ug/dL — ABNORMAL LOW (ref 41–142)
SATURATION RATIOS: 10 % — AB (ref 21–57)
TIBC: 321 ug/dL (ref 236–444)
UIBC: 290 ug/dL (ref 120–384)

## 2018-02-21 LAB — FERRITIN: Ferritin: 59 ng/mL (ref 11–307)

## 2018-02-28 ENCOUNTER — Encounter: Payer: Self-pay | Admitting: Nurse Practitioner

## 2018-02-28 ENCOUNTER — Ambulatory Visit: Payer: Self-pay | Attending: Nurse Practitioner | Admitting: Nurse Practitioner

## 2018-02-28 VITALS — BP 106/67 | HR 73 | Temp 99.1°F | Ht 60.0 in | Wt 145.2 lb

## 2018-02-28 DIAGNOSIS — E119 Type 2 diabetes mellitus without complications: Secondary | ICD-10-CM

## 2018-02-28 DIAGNOSIS — E7800 Pure hypercholesterolemia, unspecified: Secondary | ICD-10-CM

## 2018-02-28 DIAGNOSIS — Z23 Encounter for immunization: Secondary | ICD-10-CM

## 2018-02-28 LAB — POCT GLYCOSYLATED HEMOGLOBIN (HGB A1C): Hemoglobin A1C: 6.1 % — AB (ref 4.0–5.6)

## 2018-02-28 LAB — GLUCOSE, POCT (MANUAL RESULT ENTRY): POC Glucose: 137 mg/dl — AB (ref 70–99)

## 2018-02-28 MED ORDER — METFORMIN HCL 500 MG PO TABS: 500.0000 mg | ORAL_TABLET | Freq: Two times a day (BID) | ORAL | 3 refills | Status: DC

## 2018-02-28 MED FILL — metFORMIN HCL 500 MG TABS: 500 | 30 days supply | Qty: 60 | Fill #1

## 2018-02-28 NOTE — Progress Notes (Signed)
Assessment & Plan:  Amy Maldonado was seen today for follow-up.  Diagnoses and all orders for this visit:  Controlled type 2 diabetes mellitus without complication, without long-term current use of insulin (HCC) -     Glucose (CBG) -     HgB A1c -     CMP14+EGFR -     Ambulatory referral to Ophthalmology Continue blood sugar control as discussed in office today, low carbohydrate diet, and regular physical exercise as tolerated, 150 minutes per week (30 min each day, 5 days per week, or 50 min 3 days per week). Keep blood sugar logs with fasting goal of 90-130 mg/dl, post prandial (after you eat) less than 180.  For Hypoglycemia: BS <60 and Hyperglycemia BS >400; contact the clinic ASAP. Annual eye exams and foot exams are recommended.   Patient has been counseled on age-appropriate routine health concerns for screening and prevention. These are reviewed and up-to-date. Referrals have been placed accordingly. Immunizations are up-to-date or declined.    Subjective:   Chief Complaint  Patient presents with  . Follow-up    Pt. is here to follow-up on diabetes.    HPI Amy Maldonado 43 y.o. female presents to office today for DM type 2.   Type 2 Diabetes Mellitus Disease course has been improving . There are no hypoglycemic symptoms.  There are no diabetic complications. Current diabetic treatment includes metformin 500 mg BID. Patient is compliant with treatment all of the time and monitors blood glucose at home 1-2 times per day.   Home blood glucose trend : (FBS 120 mg/dl)  Weight is  stable. Patient follows a generally healthy diet. Meal planning includes avoidance of concentrated sweets. Patient has not seen a dietician. Patient is not compliant with exercise.   An ACE inhibitor/angiotensin II receptor blocker is not being taken. Patient does not see a podiatrist. Eye exam is not current.  Lab Results  Component Value Date   HGBA1C 6.1 (A) 02/28/2018   Lab Results  Component  Value Date   HGBA1C 7.1 (H) 10/01/2017      Review of Systems  Constitutional: Negative for fever, malaise/fatigue and weight loss.  HENT: Negative.  Negative for nosebleeds.   Eyes: Negative.  Negative for blurred vision, double vision and photophobia.  Respiratory: Negative.  Negative for cough and shortness of breath.   Cardiovascular: Negative.  Negative for chest pain, palpitations and leg swelling.  Gastrointestinal: Negative.  Negative for heartburn, nausea and vomiting.  Musculoskeletal: Negative.  Negative for myalgias.  Neurological: Negative.  Negative for dizziness, focal weakness, seizures and headaches.  Psychiatric/Behavioral: Negative.  Negative for suicidal ideas.    Past Medical History:  Diagnosis Date  . Anemia   . Diabetes mellitus without complication ([place])   . Hypothyroidism 2011    Past Surgical History:  Procedure Laterality Date  . CESAREAN SECTION     3 c section    Family History  Problem Relation Age of Onset  . Hypertension Mother   . Diabetes Father   . Hypertension Father   . Cancer Father        prostate cancer    Social History Reviewed with no changes to be made today.   Outpatient Medications Prior to Visit  Medication Sig Dispense Refill  . Blood Glucose Monitoring Suppl (TRUE METRIX METER) w/Device KIT Use as instructed. 1 kit 0  . ferrous sulfate 325 (65 FE) MG tablet Take 1 tablet (325 mg total) by mouth 3 (three) times daily  with meals. 180 tablet 3  . glucose blood (TRUE METRIX BLOOD GLUCOSE TEST) test strip Use as instructed. Monitor blood glucose levels once a day by fingerstick. 100 each 12  . levothyroxine (SYNTHROID, LEVOTHROID) 25 MCG tablet TAKE 1 TABLET BY MOUTH DAILY BEFORE BREAKFAST 90 tablet 1  . TRUEPLUS LANCETS 28G MISC Use as instructed. Monitor blood glucose levels once a day by fingerstick. 100 each 3  . metFORMIN (GLUCOPHAGE) 500 MG tablet Take 1 tablet (500 mg total) by mouth 2 (two) times daily with a meal.  60 tablet 3   No facility-administered medications prior to visit.     No Known Allergies     Objective:    BP 106/67 (BP Location: Left Arm, Patient Position: Sitting, Cuff Size: Normal)   Pulse 73   Temp 99.1 F (37.3 C) (Oral)   Ht 5' (1.524 m)   Wt 145 lb 3.2 oz (65.9 kg)   SpO2 100%   BMI 28.36 kg/m  Wt Readings from Last 3 Encounters:  02/28/18 145 lb 3.2 oz (65.9 kg)  12/24/17 144 lb 4.8 oz (65.5 kg)  12/07/17 149 lb (67.6 kg)    Physical Exam Vitals signs and nursing note reviewed.  Constitutional:      Appearance: She is well-developed.  HENT:     Head: Normocephalic and atraumatic.  Neck:     Musculoskeletal: Normal range of motion.  Cardiovascular:     Rate and Rhythm: Normal rate and regular rhythm.     Heart sounds: Normal heart sounds. No murmur. No friction rub. No gallop.   Pulmonary:     Effort: Pulmonary effort is normal. No tachypnea or respiratory distress.     Breath sounds: Normal breath sounds. No decreased breath sounds, wheezing, rhonchi or rales.  Chest:     Chest wall: No tenderness.  Abdominal:     General: Bowel sounds are normal.     Palpations: Abdomen is soft.  Musculoskeletal: Normal range of motion.  Skin:    General: Skin is warm and dry.  Neurological:     Mental Status: She is alert and oriented to person, place, and time.     Coordination: Coordination normal.  Psychiatric:        Behavior: Behavior normal. Behavior is cooperative.        Thought Content: Thought content normal.        Judgment: Judgment normal.          Patient has been counseled extensively about nutrition and exercise as well as the importance of adherence with medications and regular follow-up. The patient was given clear instructions to go to ER or return to medical center if symptoms don't improve, worsen or new problems develop. The patient verbalized understanding.   Follow-up: Return in about 3 months (around 05/29/2018) for DM;THYROID.    Amy Pounds, FNP-BC [facility] and [facility] [place], [place]   02/28/2018, 8:56 AM

## 2018-02-28 NOTE — Patient Instructions (Signed)
[place] (Health Maintenance, Female) Un estilo de vida saludable y los cuidados preventivos pueden favorecer considerablemente a la salud y Musician. Pregunte a su mdico cul es el cronograma de exmenes peridicos apropiado para usted. Esta es una buena oportunidad para consultarlo sobre cmo prevenir enfermedades y [place] sano. Adems de los controles, hay muchas otras cosas que puede hacer usted mismo. Los expertos han realizado numerosas investigaciones [employer] cambios en el estilo de vida y las medidas de prevencin que, [place], lo ayudarn a mantenerse sano. Solicite a su mdico ms informacin. EL PESO Y LA DIETA Consuma una dieta saludable.  Asegrese de [employer] verduras, frutas, productos lcteos de bajo contenido de [place] y Advertising account planner.  No consuma muchos alimentos de alto contenido de grasas slidas, azcares agregados o sal.  Realice actividad fsica con regularidad. Esta es una de las prcticas ms importantes que puede hacer por su salud. ? La Delorise Shiner de los adultos deben hacer ejercicio durante al menos 124mnutos por semana. El ejercicio debe aumentar la frecuencia cardaca y pActorla transpiracin (ejercicio de [place]. ? La mayora de los adultos tambin deben hacer ejercicios de elongacin al mToysRusveces a la semana. Agregue esto al su plan de ejercicio de intensidad moderada. Mantenga un peso saludable.  El ndice de masa corporal ([facility] es una medida que puede utilizarse para identificar posibles problemas de pEast Uniontown Proporciona una estimacin de la grasa corporal basndose en el peso y la altura. Su mdico puede ayudarle a dRadiation protection practitionerISouth Endy a lScientist, forensico mTheatre managerun peso saludable.  Para las mujeres de 20aos o ms: ? Un IJohn R. Oishei Children'S Hospitalmenor de 18,5 se considera bajo peso. ? Un ICumberland County Hospitalentre 18,5 y 24,9 es normal. ? Un IPelham Medical Centerentre 25 y 29,9 se considera sobrepeso. ? Un IMC de 30 o ms se considera  obesidad. Observe los niveles de colesterol y lpidos en la sangre.  Debe comenzar a rEnglish as a second language teacherde lpidos y cResearch officer, trade unionen la sangre a los 20aos y luego repetirlos cada 516aos  Es posible que nAutomotive engineerlos niveles de colesterol con mayor frecuencia si: ? Sus niveles de lpidos y colesterol son altos. ? Es mayor de 527CWC ? Presenta un alto riesgo de padecer enfermedades cardacas. DETECCIN DE CNCER Cncer de pulmn  Se recomienda realizar exmenes de deteccin de cncer de pulmn a personas adultas entre 574y 892aos que estn en riesgo de dHorticulturist, commercialde pulmn por sus antecedentes de consumo de tabaco.  Se recomienda una tomografa computarizada de baja dosis de los pulmones todos los aos a las personas que: ? Fuman actualmente. ? Hayan dejado el hbito en algn momento en los ltimos 15aos. ? Hayan fumado durante 30aos un paquete diario. Un paquete-ao equivale a fumar un promedio de un paquete de cigarrillos diario durante un ao.  Los exmenes de deteccin anuales deben continuar hasta que hayan pasado 15aos desde que dej de fumar.  Ya no debern realizarse si tiene un problema de salud que le impida recibir tratamiento para eScience writerde pulmn. Cncer de mama  Practique la autoconciencia de la mama. Esto significa reconocer la apariencia normal de sus mamas y cmo las siente.  Tambin significa realizar autoexmenes regulares de lJohnson & Johnson Informe a su mdico sobre cualquier cambio, sin importar cun pequeo sea.  Si tiene entre 20 y 363aos, un mdico debe realizarle un examen clnico de las mamas como parte del examen regular de s[place] cada 1 a  3aos.  Si tiene 40aos o ms, debe realizarse un examen clnico de las mamas todos los aos. Tambin considere realizarse una radiografa de las mamas (mamografa) todos los aos.  Si tiene antecedentes familiares de cncer de mama, hable con su mdico para someterse a un estudio gentico.  Si  tiene alto riesgo de padecer cncer de mama, hable con su mdico para someterse a una resonancia magntica y una mamografa todos los aos.  La evaluacin del gen del cncer de mama (BRCA) se recomienda a mujeres que tengan familiares con cnceres relacionados con el BRCA. Los cnceres relacionados con el BRCA incluyen los siguientes: ? Mama. ? Ovario. ? Trompas. ? Cnceres de peritoneo.  Los resultados de la evaluacin determinarn la necesidad de asesoramiento gentico y de anlisis de BRCA1 y BRCA2. Cncer de cuello del tero El mdico puede recomendarle que se haga pruebas peridicas de deteccin de cncer de los rganos de la pelvis (ovarios, tero y vagina). Estas pruebas incluyen un examen plvico, que abarca controlar si se produjeron cambios microscpicos en la superficie del cuello del tero (prueba de Papanicolaou). Pueden recomendarle que se haga estas pruebas cada 3aos, a partir de los 21aos.  A las mujeres que tienen entre 30 y 65aos, los mdicos pueden recomendarles que se sometan a exmenes plvicos y pruebas de Papanicolaou cada 3aos, o a la prueba de Papanicolaou y el examen plvico en combinacin con estudios de deteccin del virus del papiloma humano (VPH) cada 5aos. Algunos tipos de VPH aumentan el riesgo de padecer cncer de cuello del tero. La prueba para la deteccin del VPH tambin puede realizarse a mujeres de cualquier edad cuyos resultados de la prueba de Papanicolaou no sean claros.  Es posible que otros mdicos no recomienden exmenes de deteccin a mujeres no embarazadas que se consideran sujetos de bajo riesgo de padecer cncer de pelvis y que no tienen sntomas. Pregntele al mdico si un examen plvico de deteccin es adecuado para usted.  Si ha recibido un tratamiento para el cncer cervical o una enfermedad que podra causar cncer, necesitar realizarse una prueba de Papanicolaou y controles durante al menos 20 aos de concluido el tratamiento. Si no se  ha hecho el Papanicolaou con regularidad, debern volver a evaluarse los factores de riesgo (como tener un nuevo compaero sexual), para determinar si debe realizarse los estudios nuevamente. Algunas mujeres sufren problemas mdicos que aumentan la probabilidad de contraer cncer de cuello del tero. En estos casos, el mdico podr indicar que se realicen controles y pruebas de Papanicolaou con ms frecuencia. Cncer colorrectal  Este tipo de cncer puede detectarse y a menudo prevenirse.  Por lo general, los estudios de rutina se deben comenzar a hacer a partir de los 50 aos y hasta los 75 aos.  Sin embargo, el mdico podr aconsejarle que lo haga antes, si tiene factores de riesgo para el cncer de colon.  Tambin puede recomendarle que use un kit de prueba para hallar sangre oculta en la materia fecal.  Es posible que se use una pequea cmara en el extremo de un tubo para examinar directamente el colon (sigmoidoscopia o colonoscopia) a fin de detectar formas tempranas de cncer colorrectal.  Los exmenes de rutina generalmente comienzan a los 50aos.  El examen directo del colon se debe repetir cada 5 a 10aos hasta los 75aos. Sin embargo, es posible que se realicen exmenes con mayor frecuencia, si se detectan formas tempranas de plipos precancerosos o pequeos bultos. Cncer de piel  Revise la piel   de la cabeza a los pies con regularidad.  Informe a su mdico si aparecen nuevos lunares o los que tiene se modifican, especialmente en su forma y color.  Tambin notifique al mdico si tiene un lunar que es ms grande que el tamao de una goma de lpiz.  Siempre use pantalla solar. Aplique pantalla solar de manera libre y repetida a lo largo del da.  Protjase usando mangas y pantalones largos, un sombrero de ala ancha y gafas para el sol, siempre que se encuentre en el exterior. ENFERMEDADES CARDACAS, DIABETES E HIPERTENSIN ARTERIAL  La hipertensin arterial causa  enfermedades cardacas y aumenta el riesgo de ictus. La hipertensin arterial es ms probable en los siguientes casos: ? Las personas que tienen la presin arterial en el extremo del rango normal (100-139/85-89 mm Hg). ? Las personas con sobrepeso u obesidad. ? Las personas afroamericanas.  Si usted tiene entre 18 y 39 aos, debe medirse la presin arterial cada 3 a 5 aos. Si usted tiene 40 aos o ms, debe medirse la presin arterial todos los aos. Debe medirse la presin arterial dos veces: una vez cuando est en un hospital o una clnica y la otra vez cuando est en otro sitio. Registre el promedio de las dos mediciones. Para controlar su presin arterial cuando no est en un hospital o una clnica, puede usar lo siguiente: ? Una mquina automtica para medir la presin arterial en una farmacia. ? Un monitor para medir la presin arterial en el hogar.  Si tiene entre 55 y 79 aos, consulte a su mdico si debe tomar aspirina para prevenir el ictus.  Realcese exmenes de deteccin de la diabetes con regularidad. Esto incluye la toma de una muestra de sangre para controlar el nivel de azcar en la sangre durante el ayuno. ? Si tiene un peso normal y un bajo riesgo de padecer diabetes, realcese este anlisis cada tres aos despus de los 45aos. ? Si tiene sobrepeso y un alto riesgo de padecer diabetes, considere someterse a este anlisis antes o con mayor frecuencia. PREVENCIN DE INFECCIONES HepatitisB  Si tiene un riesgo ms alto de contraer hepatitis B, debe someterse a un examen de deteccin de este virus. Se considera que tiene un alto riesgo de contraer hepatitis B si: ? Naci en un pas donde la hepatitis B es frecuente. Pregntele a su mdico qu pases son considerados de alto riesgo. ? Sus padres nacieron en un pas de alto riesgo y usted no recibi una vacuna que lo proteja contra la hepatitis B (vacuna contra la hepatitis B). ? Tiene VIH o sida. ? Usa agujas para inyectarse  drogas. ? Vive con alguien que tiene hepatitis B. ? Ha tenido sexo con alguien que tiene hepatitis B. ? Recibe tratamiento de hemodilisis. ? Toma ciertos medicamentos para el cncer, trasplante de rganos y afecciones autoinmunitarias. Hepatitis C  Se recomienda un anlisis de sangre para: ? Todos los que nacieron entre 1945 y 1965. ? Todas las personas que tengan un riesgo de haber contrado hepatitis C. Enfermedades de transmisin sexual (ETS).  Debe realizarse pruebas de deteccin de enfermedades de transmisin sexual (ETS), incluidas gonorrea y clamidia si: ? Es sexualmente activo y es menor de 24aos. ? Es mayor de 24aos, y el mdico le informa que corre riesgo de tener este tipo de infecciones. ? La actividad sexual ha cambiado desde que le hicieron la ltima prueba de deteccin y tiene un riesgo mayor de tener clamidia o gonorrea. Pregntele al mdico si usted   tiene riesgo.  Si no tiene el VIH, pero corre riesgo de infectarse por el virus, se recomienda tomar diariamente un medicamento recetado para evitar la infeccin. Esto se conoce como profilaxis previa a la exposicin. Se considera que est en riesgo si: ? Es [place] sexualmente y no [place] preservativos habitualmente o no conoce el estado del VIH de sus Advertising copywriter. ? Se inyecta drogas. ? Es [place] sexualmente con Ardelia Mems pareja que tiene VIH. Consulte a su mdico para saber si tiene un alto riesgo de infectarse por el VIH. Si opta por comenzar la profilaxis previa a la exposicin, primero debe realizarse anlisis de deteccin del VIH. Luego, le harn anlisis cada 54mses mientras est tomando los medicamentos para la profilaxis previa a la exposicin. EJeff[facility] Center [facility] Si es premenopusica y puede quedar eArbutus solicite a su mdico asesoramiento previo a la concepcin.  Si puede quedar embarazada, tome 400 a [id number](mcg) de cido fAnheuser-Busch  Si desea evitar el embarazo, hable con su mdico sobre el  control de la natalidad (anticoncepcin). OSTEOPOROSIS Y MENOPAUSIA  La osteoporosis es una enfermedad en la que los huesos pierden los minerales y la fuerza por el avance de la edad. El resultado pueden ser fracturas graves en los hLeaf River El riesgo de osteoporosis puede identificarse con uArdelia Memsprueba de densidad sea.  Si tiene 65aos o ms, o si est en riesgo de sufrir osteoporosis y fracturas, pregunte a su mdico si debe someterse a exmenes.  Consulte a su mdico si debe tomar un suplemento de calcio o de vitamina D para reducir el riesgo de osteoporosis.  La menopausia puede presentar ciertos sntomas fsicos y rGaffer  La terapia de reemplazo hormonal puede reducir algunos de estos sntomas y rGaffer Consulte a su mdico para saber si la terapia de reemplazo hormonal es conveniente para usted. INSTRUCCIONES PARA EL CUIDADO EN EL HOGAR  Realcese los estudios de rutina de la salud, dentales y de lPublic librarian  MKennard  No consuma ningn producto que contenga tabaco, lo que incluye cigarrillos, tabaco de mHigher education careers advisero cPsychologist, sport and exercise  Si est embarazada, no beba alcohol.  Si est amamantando, reduzca el consumo de alcohol y la frecuencia con la que consume.  Si es mujer y no est embarazada limite el consumo de alcohol a no ms de 1 medida por da. Una medida equivale a 12onzas de cerveza, 5onzas de vino o 1onzas de bebidas alcohlicas de alta graduacin.  No consuma drogas.  No comparta agujas.  Solicite ayuda a su mdico si necesita apoyo o informacin para abandonar las drogas.  Informe a su mdico si a menudo se siente deprimido.  Notifique a su mdico si alguna vez ha sido vctima de abuso o si no se siente seguro en su hogar. Esta informacin no tiene cMarine scientistel consejo del mdico. Asegrese de hacerle al mdico cualquier pregunta que tenga. Document Released: 12/18/2010 Document Revised: 01/19/2014 Document Reviewed:  10/02/2014 Elsevier Interactive Patient Education  2019 [employer]Reyn[employer]

## 2018-02-28 NOTE — Addendum Note (Signed)
Addended byMariane Baumgarten on: 02/28/2018 09:11 AM   Modules accepted: Orders

## 2018-03-01 LAB — CMP14+EGFR
ALK PHOS: 73 IU/L (ref 39–117)
ALT: 24 IU/L (ref 0–32)
AST: 17 IU/L (ref 0–40)
Albumin/Globulin Ratio: 1.5 (ref 1.2–2.2)
Albumin: 4.3 g/dL (ref 3.8–4.8)
BUN / CREAT RATIO: 18 (ref 9–23)
BUN: 10 mg/dL (ref 6–24)
CHLORIDE: 102 mmol/L (ref 96–106)
CO2: 23 mmol/L (ref 20–29)
Calcium: 9.3 mg/dL (ref 8.7–10.2)
Creatinine, Ser: 0.55 mg/dL — ABNORMAL LOW (ref 0.57–1.00)
GFR calc Af Amer: 134 mL/min/{1.73_m2} (ref >59)
GFR calc non Af Amer: 116 mL/min/{1.73_m2} (ref >59)
Globulin, Total: 2.8 g/dL (ref 1.5–4.5)
Glucose: 144 mg/dL — ABNORMAL HIGH (ref 65–99)
Potassium: 4.3 mmol/L (ref 3.5–5.2)
Sodium: 139 mmol/L (ref 134–144)
Total Protein: 7.1 g/dL (ref 6.0–8.5)

## 2018-03-01 LAB — LIPID PANEL
CHOLESTEROL TOTAL: 183 mg/dL (ref 100–199)
Chol/HDL Ratio: 3.7 ratio (ref 0.0–4.4)
HDL: 50 mg/dL (ref >39)
LDL CALC: 112 mg/dL — AB (ref 0–99)
TRIGLYCERIDES: 107 mg/dL (ref 0–149)
VLDL CHOLESTEROL CAL: 21 mg/dL (ref 5–40)

## 2018-03-02 ENCOUNTER — Telehealth: Payer: Self-pay

## 2018-03-02 NOTE — Telephone Encounter (Signed)
Patient was called by spanish interpreter Almyra Free regarding lab results. See Dr. Ernestina Penna note

## 2018-03-02 NOTE — Telephone Encounter (Signed)
CMA attempt to reach patient to inform on results.  No answer and left a VM for patient to call back.  

## 2018-03-02 NOTE — Telephone Encounter (Signed)
-----   Message from Gildardo Pounds, NP sent at 03/01/2018 11:47 PM EST ----- Labs are essentially normal. INSTRUCTIONS: Work on a low fat, heart healthy diet and participate in regular aerobic exercise program by working out at least 150 minutes per week; 5 days a week-30 minutes per day. Avoid red meat, fried foods. junk foods, sodas, sugary drinks, unhealthy snacking, alcohol and smoking.  Drink at least 48oz of water per day and monitor your carbohydrate intake daily.

## 2018-03-02 NOTE — Telephone Encounter (Signed)
-----   Message from Truitt Merle, MD sent at 02/23/2018  8:38 AM EST ----- Please let her know her anemia resolved now, iron level improved, although still slightly low, continue oral iron 3 times a day, and f/u lab in April, she will likely need iv iron next time, thanks   Truitt Merle  02/23/2018

## 2018-03-03 NOTE — Telephone Encounter (Signed)
CMA attempt to reach patient to inform patient on results.  No answer and left a VM for patient.

## 2018-03-04 ENCOUNTER — Telehealth: Payer: Self-pay | Admitting: *Deleted

## 2018-03-04 NOTE — Telephone Encounter (Signed)
[place]  Follow-up  Spanish interpreter- Rudene Anda  Medications:  Patients states she is not taking any medications for high cholesterol, or high blood pressure. Patient states that she is taking medication for   diabetes.  She is not taking aspirin daily to prevent heart attack or stroke.    Blood pressure, self measurement:  Patients states she does not measure blood pressure at home.  Nutrition:  Patient states she eats 2 cups of fruit and 2 cups of vegetables in an average day.  Patient states she does not eat fish regularly, she eats more than half a serving of whole grains daily. She drinks less than 36 ounces of beverages with added sugar weekly.  She is currently watching her sodium intake.  She has not had any drinks containing alcohol in the last seven days.    Physical activity:  Patient states that she gets 0 minutes of moderate exercise in a week.  She gets 0 minutes of vigorous exercise per week.  Patient states that this is due to the weather.  I encouraged patient to try to exercise inside with Zumba or exercise dvd.  Smoking status:  Patient states she has never smoked and is not around any smokers.    Quality of life:  Patient states that she has had 0 bad physical days out of the last 30 days. In the last 2 weeks, she has had 0 days that she has felt down or depressed. She has had 0 days in the last 2 weeks that she has had little interest or pleasure in doing things.  Labs- Since being in the program the patient's A1C has dropped from 7.1 to 6.1

## 2018-04-22 ENCOUNTER — Inpatient Hospital Stay (HOSPITAL_BASED_OUTPATIENT_CLINIC_OR_DEPARTMENT_OTHER): Payer: Self-pay | Admitting: Medical

## 2018-04-22 ENCOUNTER — Inpatient Hospital Stay: Payer: Self-pay | Attending: Hematology and Oncology

## 2018-04-22 ENCOUNTER — Other Ambulatory Visit: Payer: Self-pay

## 2018-04-22 DIAGNOSIS — R519 Headache, unspecified: Secondary | ICD-10-CM

## 2018-04-22 DIAGNOSIS — R509 Fever, unspecified: Secondary | ICD-10-CM | POA: Insufficient documentation

## 2018-04-22 DIAGNOSIS — N920 Excessive and frequent menstruation with regular cycle: Secondary | ICD-10-CM | POA: Insufficient documentation

## 2018-04-22 DIAGNOSIS — R51 Headache: Secondary | ICD-10-CM | POA: Insufficient documentation

## 2018-04-22 DIAGNOSIS — K0889 Other specified disorders of teeth and supporting structures: Secondary | ICD-10-CM | POA: Insufficient documentation

## 2018-04-22 DIAGNOSIS — D500 Iron deficiency anemia secondary to blood loss (chronic): Secondary | ICD-10-CM

## 2018-04-22 LAB — CBC WITH DIFFERENTIAL (CANCER CENTER ONLY)
Abs Immature Granulocytes: 0.05 10*3/uL (ref 0.00–0.07)
Basophils Absolute: 0 10*3/uL (ref 0.0–0.1)
Basophils Relative: 0 %
Eosinophils Absolute: 0.1 10*3/uL (ref 0.0–0.5)
Eosinophils Relative: 1 %
HCT: 34.7 % — ABNORMAL LOW (ref 36.0–46.0)
Hemoglobin: 11 g/dL — ABNORMAL LOW (ref 12.0–15.0)
Immature Granulocytes: 0 %
Lymphocytes Relative: 13 %
Lymphs Abs: 1.6 10*3/uL (ref 0.7–4.0)
MCH: 30.3 pg (ref 26.0–34.0)
MCHC: 31.7 g/dL (ref 30.0–36.0)
MCV: 95.6 fL (ref 80.0–100.0)
Monocytes Absolute: 1 10*3/uL (ref 0.1–1.0)
Monocytes Relative: 8 %
Neutro Abs: 10 10*3/uL — ABNORMAL HIGH (ref 1.7–7.7)
Neutrophils Relative %: 78 %
Platelet Count: 330 10*3/uL (ref 150–400)
RBC: 3.63 MIL/uL — ABNORMAL LOW (ref 3.87–5.11)
RDW: 13.9 % (ref 11.5–15.5)
WBC Count: 12.8 10*3/uL — ABNORMAL HIGH (ref 4.0–10.5)
nRBC: 0 % (ref 0.0–0.2)

## 2018-04-25 LAB — FERRITIN: Ferritin: 26 ng/mL (ref 11–307)

## 2018-04-25 LAB — IRON AND TIBC
Iron: 22 ug/dL — ABNORMAL LOW (ref 41–142)
Saturation Ratios: 7 % — ABNORMAL LOW (ref 21–57)
TIBC: 329 ug/dL (ref 236–444)
UIBC: 306 ug/dL (ref 120–384)

## 2018-04-25 NOTE — Progress Notes (Signed)
[place] [id number] Aug 27, 1975 43 y.o.  Amy Maldonado is managed by Dr. Truitt Merle  Actively treated with chemotherapy/immunotherapy/hormonal therapy: no  Next scheduled appointment with provider: 06/24/2018  Assessment: Plan:    Acute nonintractable headache, unspecified headache type  Fever, unspecified fever cause  Toothache  Iron deficiency anemia due to chronic blood loss   Acute headache, fever, toothache: The patient has a fever of 100.5 and is having right tooth pain.  She believes that she may have broken a tooth yesterday.  Her review does not appear that any of her symptoms are related to COVID-19.  The patient was instructed to follow-up with her primary care provider or a dentist should her symptoms continue.  Iron deficiency anemia: The patient is here for labs today and is scheduled to follow-up with Dr. Burr Medico on 06/24/2018.  Please see After Visit Summary for patient specific instructions.  Future Appointments  Date Time Provider [place]  06/03/2018  3:50 PM Gildardo Pounds, NP [facility] None  06/24/2018  3:00 PM [facility] 3 [facility] None  06/24/2018  3:30 PM Truitt Merle, MD [facility] None    No orders of the defined types were placed in this encounter.      Subjective:   Patient ID:  Amy Maldonado is a 43 y.o. (DOB 1975-05-19) female.  Chief Complaint: No chief complaint on file.   HPI Amy Maldonado  is a 43 y.o. female who was seen in the waiting area today after she was noted to have a temperature of 100.5.  She reports that she has had a headache and a tooth ache in her right jaw since yesterday.  She believes that she could have broken a tooth.  Are you experiencing any of the following:  Shortness of breath:        No Cough :         No Sneezing:         No Rhinorrhea:         No Sore throat:         No Headache:         Yes Diarrhea:         No Fever:           Yes  Have you been tested for COVID-19?     No Have you tested positive for COVID-19?     No Have you had contact with anyone testing positive for COVID-19?  No       Allergies: No Known Allergies  Please see review of systems for further details on the patient's review from today.   Review of Systems:  Review of Systems  Constitutional: Positive for fever. Negative for chills and diaphoresis.  HENT: Positive for dental problem. Negative for congestion, rhinorrhea, sneezing and sore throat.   Respiratory: Negative for cough and shortness of breath.   Cardiovascular: Negative for chest pain.  Gastrointestinal: Negative for diarrhea.  Neurological: Positive for headaches.    Objective:   Physical Exam:  Vital Signs: Temperature:   Physical Exam Constitutional:      General: She is not in acute distress.    Appearance: She is not diaphoretic.  HENT:     Head: Normocephalic and atraumatic.  Cardiovascular:     Rate and Rhythm: Normal rate and regular rhythm.     Heart sounds: Normal heart sounds. No murmur. No friction rub. No gallop.   Pulmonary:     Effort: Pulmonary  effort is normal. No respiratory distress.     Breath sounds: Normal breath sounds. No wheezing or rales.  Skin:    General: Skin is warm and dry.     Findings: No erythema or rash.  Neurological:     Mental Status: She is alert.     Lab Review:     Component Value Date/Time   NA 139 02/28/2018 0914   K 4.3 02/28/2018 0914   CL 102 02/28/2018 0914   CO2 23 02/28/2018 0914   GLUCOSE 144 (H) 02/28/2018 0914   GLUCOSE 225 (H) 09/20/2017 1539   BUN 10 02/28/2018 0914   CREATININE 0.55 (L) 02/28/2018 0914   CREATININE 1.10 (H) 09/20/2017 1539   CREATININE 0.60 10/10/2015 1534   CALCIUM 9.3 02/28/2018 0914   PROT 7.1 02/28/2018 0914   ALBUMIN 4.3 02/28/2018 0914   AST 17 02/28/2018 0914   AST 24 09/20/2017 1539   ALT 24 02/28/2018 0914   ALT 45 (H) 09/20/2017 1539   ALKPHOS 73 02/28/2018 0914    BILITOT <0.2 02/28/2018 0914   BILITOT <0.2 (L) 09/20/2017 1539   GFRNONAA 116 02/28/2018 0914   GFRNONAA >60 09/20/2017 1539   GFRNONAA >89 09/06/2012 1103   GFRAA 134 02/28/2018 0914   GFRAA >60 09/20/2017 1539   GFRAA >89 09/06/2012 1103       Component Value Date/Time   WBC 12.8 (H) 04/22/2018 1503   WBC 9.9 10/10/2015 1534   RBC 3.63 (L) 04/22/2018 1503   HGB 11.0 (L) 04/22/2018 1503   HGB 12.0 02/12/2017 1625   HCT 34.7 (L) 04/22/2018 1503   HCT 37.1 02/12/2017 1625   PLT 330 04/22/2018 1503   PLT 279 02/12/2017 1625   MCV 95.6 04/22/2018 1503   MCV 89 02/12/2017 1625   MCH 30.3 04/22/2018 1503   MCHC 31.7 04/22/2018 1503   RDW 13.9 04/22/2018 1503   RDW 19.2 (H) 02/12/2017 1625   LYMPHSABS 1.6 04/22/2018 1503   MONOABS 1.0 04/22/2018 1503   EOSABS 0.1 04/22/2018 1503   BASOSABS 0.0 04/22/2018 1503   -------------------------------  Imaging from last 24 hours (if applicable):  Radiology interpretation: No results found.

## 2018-05-09 ENCOUNTER — Telehealth: Payer: Self-pay | Admitting: *Deleted

## 2018-05-09 NOTE — Telephone Encounter (Signed)
TCT patient No answer. Unable to leave VM message. Scheduling message sent for IV fereheme.

## 2018-05-09 NOTE — Telephone Encounter (Signed)
-----   Message from Truitt Merle, MD sent at 04/26/2018 12:38 PM EDT -----  ----- Message ----- From: Truitt Merle, MD Sent: 04/25/2018  10:59 PM EDT To: Arlice Colt Pod 1  Myrtle, please let pt know her lab results, iron level slightly low, mild anemia again, I will set up iv iron once for her, thanks  Truitt Merle  04/25/2018

## 2018-05-10 ENCOUNTER — Telehealth: Payer: Self-pay | Admitting: Hematology

## 2018-05-10 NOTE — Telephone Encounter (Signed)
Scheduled iron infusion per sch msg. Will call patient with interpreter.

## 2018-05-11 ENCOUNTER — Telehealth: Payer: Self-pay | Admitting: Hematology

## 2018-05-11 NOTE — Telephone Encounter (Signed)
Called regarding upcoming appointment with translator, patient is notified and will receive a mailed calender.

## 2018-05-12 ENCOUNTER — Other Ambulatory Visit: Payer: Self-pay | Admitting: Hematology

## 2018-05-12 ENCOUNTER — Other Ambulatory Visit: Payer: Self-pay

## 2018-05-12 ENCOUNTER — Inpatient Hospital Stay: Payer: Self-pay

## 2018-05-12 VITALS — BP 96/60 | HR 77 | Temp 97.8°F | Resp 18

## 2018-05-12 DIAGNOSIS — D500 Iron deficiency anemia secondary to blood loss (chronic): Secondary | ICD-10-CM

## 2018-05-12 MED ORDER — SODIUM CHLORIDE 0.9 % IV SOLN
510.0000 mg | Freq: Once | INTRAVENOUS | Status: AC
  Administered 2018-05-12: 510 mg via INTRAVENOUS
  Filled 2018-05-12: qty 17

## 2018-05-12 MED ORDER — SODIUM CHLORIDE 0.9 % IV SOLN
Freq: Once | INTRAVENOUS | Status: AC
  Administered 2018-05-12: 09:00:00 via INTRAVENOUS
  Filled 2018-05-12: qty 250

## 2018-05-12 NOTE — Patient Instructions (Signed)
Coronavirus (COVID-19) Are you at risk?  Are you at risk for the Coronavirus (COVID-19)?  To be considered HIGH RISK for Coronavirus (COVID-19), you have to meet the following criteria:  . Traveled to [place], [place], [place], [place] or [place]; or in the [place] to [place], [place], [place], or [place]; and have fever, cough, and shortness of breath within the last 2 weeks of travel OR . Been in close contact with a person diagnosed with COVID-19 within the last 2 weeks and have fever, cough, and shortness of breath . IF YOU DO NOT MEET THESE CRITERIA, YOU ARE CONSIDERED LOW RISK FOR COVID-19.  What to do if you are HIGH RISK for COVID-19?  . If you are having a medical emergency, call 911. . Seek medical care right away. Before you go to a doctor's office, urgent care or emergency department, call ahead and tell them about your recent travel, contact with someone diagnosed with COVID-19, and your symptoms. You should receive instructions from your physician's office regarding next steps of care.  . When you arrive at healthcare provider, tell the healthcare staff immediately you have returned from visiting [place], [place], [place], [place] or [place]; or traveled in the [place] to [place], [place], [place], or [place]; in the last two weeks or you have been in close contact with a person diagnosed with COVID-19 in the last 2 weeks.   . Tell the health care staff about your symptoms: fever, cough and shortness of breath. . After you have been seen by a medical provider, you will be either: o Tested for (COVID-19) and discharged home on quarantine except to seek medical care if symptoms worsen, and asked to  - Stay home and avoid contact with others until you get your results (4-5 days)  - Avoid travel on public transportation if possible (such as bus, train, or airplane) or o Sent to the Emergency Department by EMS for evaluation, COVID-19 testing, and possible  admission depending on your condition and test results.  What to do if you are LOW RISK for COVID-19?  Reduce your risk of any infection by using the same precautions used for avoiding the common cold or flu:  . Wash your hands often with soap and warm water for at least 20 seconds.  If soap and water are not readily available, use an alcohol-based hand sanitizer with at least 60% alcohol.  . If coughing or sneezing, cover your mouth and nose by coughing or sneezing into the elbow areas of your shirt or coat, into a tissue or into your sleeve (not your hands). . Avoid shaking hands with others and consider head nods or verbal greetings only. . Avoid touching your eyes, nose, or mouth with unwashed hands.  . Avoid close contact with people who are sick. . Avoid places or events with large numbers of people in one location, like concerts or sporting events. . Carefully consider travel plans you have or are making. . If you are planning any travel outside or inside the [place], visit the CDC's Travelers' Health webpage for the latest health notices. . If you have some symptoms but not all symptoms, continue to monitor at home and seek medical attention if your symptoms worsen. . If you are having a medical emergency, call 911.   ADDITIONAL HEALTHCARE OPTIONS FOR PATIENTS  [facility] / e-Visit: [email or web address]         [facility]: [phone]  Onset   Urgent Care: [facility]: [phone]    Ferumoxytol injection Qu es este medicamento? El FERUMOXITOL es un complejo de hierro. El hierro se [place] para producir glbulos rojos saludables, que transportan oxgeno y nutrientes por el cuerpo. Este medicamento se [place] para tratar la anemia por deficiencia de hierro. Este medicamento puede ser utilizado para otros usos; si tiene alguna pregunta consulte con su proveedor de atencin  mdica o con su farmacutico. MARCAS COMUNES: Feraheme Qu le debo informar a mi profesional de la salud antes de tomar este medicamento? Necesita saber si usted presenta alguno de los [employer] o situaciones: -anemia no provocada por niveles bajos de hierro -niveles altos de hierro en la sangre -examen de imgenes por Health visitor (IRM) programado -una reaccin alrgica o inusual al hierro, otros medicamentos, alimentos, colorantes o conservantes -si est embarazada o buscando quedar embarazada -si est amamantando a un beb Cmo debo Insurance account manager medicamento? Este medicamento se administra mediante inyeccin por va intravenosa. Lo administra un profesional de Technical sales engineer en un hospital o en un entorno clnico. Hable con su pediatra para informarse acerca del uso de este medicamento en nios. Puede requerir atencin especial. Sobredosis: Pngase en contacto inmediatamente con un centro toxicolgico o una sala de urgencia si usted cree que haya tomado demasiado medicamento. ATENCIN: [employer] es solo para usted. No comparta este medicamento con nadie. Qu sucede si me olvido de una dosis? Es importante no olvidar ninguna dosis. Informe a su mdico o a su profesional de la salud si no puede asistir a Photographer. Qu puede interactuar con este medicamento? Esta medicina puede interactuar con los siguientes medicamentos: -otros productos de hierro Puede ser que esta lista no menciona todas las posibles interacciones. Informe a su profesional de [employer]	[place] de [employer] productos a base de hierbas, medicamentos de [place] o suplementos nutritivos que est tomando. Si usted fuma, consume bebidas alcohlicas o si utiliza drogas ilegales, indqueselo tambin a su profesional de [employer]	[place]. Algunas sustancias pueden interactuar con su medicamento. A qu debo estar atento al usar [employer]? Visite a su mdico o a su profesional de la salud de [place] regular. Si los  sntomas no comienzan a mejorar o si empeoran, consulte con su mdico o con su profesional de [employer]	[place]. Tal vez necesita realizarse anlisis de sangre mientras recibe Oasis. Tal vez necesita seguir Counselling psychologist. Consulte a su mdico. Los alimentos que contienen hierro incluyen: alimentos integrales o con cereales, frutas secas, frijoles o arvejas, vegetales de hoja verde y carne que proviene de rganos (hgado, rin). Qu efectos secundarios puedo tener al [employer] este medicamento? Efectos secundarios que debe informar a su mdico o a Barrister's clerk de la salud tan pronto como sea posible: Chief of Staff, como erupcin cutnea, picazn o urticarias, e hinchazn de la cara, los labios o la lengua problemas respiratorios cambios en la presin sangunea sensacin de desmayos o aturdimiento, cadas fiebre o escalofros enrojecimiento, sudoracin o sensacin de calor hinchazn de tobillos o pies Efectos secundarios que generalmente no requieren atencin mdica (infrmelos a su mdico o a Barrister's clerk de la salud si persisten o si son molestos): diarrea dolor de cabeza nuseas, vmito dolor estomacal Puede ser que esta lista no menciona todos los posibles efectos secundarios. Comunquese a su mdico por asesoramiento mdico [employer] Usted puede [employer] secundarios a  la FDA por telfono al 1-800-FDA-1088. Dnde debo guardar mi medicina? Este medicamento se administra en hospitales o clnicas y no necesitar guardarlo en su domicilio. ATENCIN: Este folleto es un resumen. Puede ser que no cubra toda la posible informacin. Si usted tiene preguntas acerca de esta medicina, consulte con su mdico, su farmacutico o su profesional de Technical sales engineer.  2019 Elsevier/Gold Standard (2016-04-21 00:00:00)

## 2018-06-01 ENCOUNTER — Other Ambulatory Visit: Payer: Self-pay | Admitting: Nurse Practitioner

## 2018-06-01 DIAGNOSIS — E039 Hypothyroidism, unspecified: Secondary | ICD-10-CM

## 2018-06-03 ENCOUNTER — Other Ambulatory Visit: Payer: Self-pay

## 2018-06-03 ENCOUNTER — Ambulatory Visit: Payer: Self-pay | Admitting: Nurse Practitioner

## 2018-06-03 ENCOUNTER — Ambulatory Visit: Payer: Self-pay | Attending: Nurse Practitioner

## 2018-06-03 DIAGNOSIS — E039 Hypothyroidism, unspecified: Secondary | ICD-10-CM

## 2018-06-04 LAB — TSH: TSH: 6.39 u[IU]/mL — ABNORMAL HIGH (ref 0.450–4.500)

## 2018-06-06 ENCOUNTER — Other Ambulatory Visit: Payer: Self-pay | Admitting: Nurse Practitioner

## 2018-06-06 DIAGNOSIS — E039 Hypothyroidism, unspecified: Secondary | ICD-10-CM

## 2018-06-06 MED ORDER — LEVOTHYROXINE SODIUM 25 MCG PO TABS: ORAL_TABLET | ORAL | 1 refills | Status: DC

## 2018-06-08 ENCOUNTER — Telehealth: Payer: Self-pay

## 2018-06-08 ENCOUNTER — Ambulatory Visit: Payer: Self-pay | Attending: Nurse Practitioner | Admitting: Nurse Practitioner

## 2018-06-08 ENCOUNTER — Other Ambulatory Visit: Payer: Self-pay

## 2018-06-08 ENCOUNTER — Encounter: Payer: Self-pay | Admitting: Nurse Practitioner

## 2018-06-08 DIAGNOSIS — E039 Hypothyroidism, unspecified: Secondary | ICD-10-CM

## 2018-06-08 NOTE — Progress Notes (Signed)
Virtual Visit via Telephone Note Due to national recommendations of social distancing due to [place] 19, telehealth visit is felt to be most appropriate for this patient at this time.  I discussed the limitations, risks, security and privacy concerns of performing an evaluation and management service by telephone and the availability of in person appointments. I also discussed with the patient that there may be a patient responsible charge related to this service. The patient expressed understanding and agreed to proceed.    I connected with Amy Maldonado on 06/08/18  at   3:50 PM EDT  EDT by telephone and verified that I am speaking with the correct person using two identifiers.   Consent I discussed the limitations, risks, security and privacy concerns of performing an evaluation and management service by telephone and the availability of in person appointments. I also discussed with the patient that there may be a patient responsible charge related to this service. The patient expressed understanding and agreed to proceed.   Location of Patient: Private Residence   Location of Provider: [place] and [place] participating in Telemedicine visit: Geryl Rankins FNP-BC [place] interpreter was used to communicate directly with patient for the entire encounter including providing detailed patient instructions.    History of Present Illness: Telemedicine visit for: TSH. She is also requesting that her A1c be drawn however I have instructed her that her A1c is not due for a few months.   Hypothyroidism Amy Maldonado is a 43 y.o. female who presents for follow up of hypothyroidism. Taking Synthroid 25mg  as prescribed. Current sympt:  denies change in energy level, diarrhea, heat / cold intolerance, nervousness, palpitations and weight changes. Symptoms have stabilized. Lab Results  Component Value Date   TSH  6.390 (H) 06/03/2018       Past Medical History:  Diagnosis Date  . Anemia   . Diabetes mellitus without complication ([place])   . Hypothyroidism 2011    Past Surgical History:  Procedure Laterality Date  . CESAREAN SECTION     3 c section    Family History  Problem Relation Age of Onset  . Hypertension Mother   . Diabetes Father   . Hypertension Father   . Cancer Father        prostate cancer    Social History   Socioeconomic History  . Marital status: Married    Spouse name: Not on file  . Number of children: Not on file  . Years of education: Not on file  . Highest education level: Not on file  Occupational History  . Not on file  Social Needs  . Financial resource strain: Not on file  . Food insecurity:    Worry: Not on file    Inability: Not on file  . Transportation needs:    Medical: Not on file    Non-medical: Not on file  Tobacco Use  . Smoking status: Never Smoker  . Smokeless tobacco: Never Used  Substance and Sexual Activity  . Alcohol use: No  . Drug use: No  . Sexual activity: Yes  Lifestyle  . Physical activity:    Days per week: Not on file    Minutes per session: Not on file  . Stress: Not on file  Relationships  . Social connections:    Talks on phone: Not on file    Gets together: Not on file    Attends religious service: Not  on file    Active member of club or organization: Not on file    Attends meetings of clubs or organizations: Not on file    Relationship status: Not on file  Other Topics Concern  . Not on file  Social History Narrative  . Not on file     Observations/Objective: Awake, alert and oriented x 3   Review of Systems  Constitutional: Negative for fever, malaise/fatigue and weight loss.  HENT: Negative.  Negative for nosebleeds.   Eyes: Negative.  Negative for blurred vision, double vision and photophobia.  Respiratory: Negative.  Negative for cough and shortness of breath.   Cardiovascular: Negative.   Negative for chest pain, palpitations and leg swelling.  Gastrointestinal: Negative.  Negative for heartburn, nausea and vomiting.  Musculoskeletal: Negative.  Negative for myalgias.  Neurological: Negative.  Negative for dizziness, focal weakness, seizures and headaches.  Psychiatric/Behavioral: Negative.  Negative for suicidal ideas.    Assessment and Plan: Amy Maldonado was evaluated today for medication refill.  Diagnoses and all orders for this visit:  Hypothyroidism, unspecified type Continue synthroid as prescribed. Take 30 minutes prior to any additional medications    Follow Up Instructions Return in about 3 months (around 09/08/2018) for DM.     I discussed the assessment and treatment plan with the patient. The patient was provided an opportunity to ask questions and all were answered. The patient agreed with the plan and demonstrated an understanding of the instructions.   The patient was advised to call back or seek an in-person evaluation if the symptoms worsen or if the condition fails to improve as anticipated.  I provided 18 minutes of non-face-to-face time during this encounter including median intraservice time, reviewing previous notes, labs, imaging, medications and explaining diagnosis and management.  Gildardo Pounds, FNP-BC

## 2018-06-08 NOTE — Telephone Encounter (Signed)
Patient was informed on lab results in her televisit.

## 2018-06-08 NOTE — Telephone Encounter (Signed)
-----   Message from Gildardo Pounds, NP sent at 06/06/2018  1:23 PM EDT ----- Thyroid level is elevated. Please take your thyroid medication as prescribed around the same time every day and 15-30 minutes prior to taking any other medication. Will recheck thyroid levels in 6 weeks. Please make a lab appointment

## 2018-06-11 ENCOUNTER — Encounter: Payer: Self-pay | Admitting: Nurse Practitioner

## 2018-06-22 NOTE — Progress Notes (Signed)
[place]   [phone] [phone]   Clinic Follow up Note   Patient Care Team: Gildardo Pounds, NP as PCP - General (Nurse Practitioner)  Date of Service:  06/24/2018  CHIEF COMPLAINT:  F/u anemia   CURRENT THERAPY:  Continue ferrous sulfate 325 mg oral 3 times a day.  IV Feriheme as needed   INTERVAL HISTORY:  Amy Maldonado is here for a follow up of anemia. She presents to the clinic with her Spanish  interpretor. She notes her last IV Iron in 04/2018 went well and she felt better afterward. She denies current fatigue. She is taking oral iron with no issues. She notes she has been drinking more green juices.     REVIEW OF SYSTEMS:   Constitutional: Denies fevers, chills or abnormal weight loss Eyes: Denies blurriness of vision Ears, nose, mouth, throat, and face: Denies mucositis or sore throat Respiratory: Denies cough, dyspnea or wheezes Cardiovascular: Denies palpitation, chest discomfort or lower extremity swelling Gastrointestinal:  Denies nausea, heartburn or change in bowel habits Skin: Denies abnormal skin rashes Lymphatics: Denies new lymphadenopathy or easy bruising Neurological:Denies numbness, tingling or new weaknesses Behavioral/Psych: Mood is stable, no new changes  All other systems were reviewed with the patient and are negative.  MEDICAL HISTORY:  Past Medical History:  Diagnosis Date  . Anemia   . Diabetes mellitus without complication ([place])   . Hypothyroidism 2011    SURGICAL HISTORY: Past Surgical History:  Procedure Laterality Date  . CESAREAN SECTION     3 c section    I have reviewed the social history and family history with the patient and they are unchanged from previous note.  ALLERGIES:  has No Known Allergies.  MEDICATIONS:  Current Outpatient Medications  Medication Sig Dispense Refill  . Blood Glucose Monitoring Suppl (TRUE METRIX METER) w/Device KIT Use as instructed. 1 kit 0  .  ferrous sulfate 325 (65 FE) MG tablet Take 1 tablet (325 mg total) by mouth 3 (three) times daily with meals. 180 tablet 3  . glucose blood (TRUE METRIX BLOOD GLUCOSE TEST) test strip Use as instructed. Monitor blood glucose levels once a day by fingerstick. 100 each 12  . levothyroxine (SYNTHROID) 25 MCG tablet TAKE 1 TABLET BY MOUTH DAILY BEFORE BREAKFAST 30 tablet 1  . metFORMIN (GLUCOPHAGE) 500 MG tablet Take 1 tablet (500 mg total) by mouth 2 (two) times daily with a meal for 30 days. 60 tablet 3  . TRUEPLUS LANCETS 28G MISC Use as instructed. Monitor blood glucose levels once a day by fingerstick. 100 each 3   No current facility-administered medications for this visit.     PHYSICAL EXAMINATION: ECOG PERFORMANCE STATUS: 0 - Asymptomatic  Vitals:   06/24/18 1523  BP: 106/64  Pulse: 73  Resp: 17  Temp: 98.5 F (36.9 C)  SpO2: 100%   Filed Weights   06/24/18 1523  Weight: 151 lb 12.8 oz (68.9 kg)    GENERAL:alert, no distress and comfortable SKIN: skin color, texture, turgor are normal, no rashes or significant lesions EYES: normal, Conjunctiva are pink and non-injected, sclera clear  NECK: supple, thyroid normal size, non-tender, without nodularity LYMPH:  no palpable lymphadenopathy in the cervical, axillary  LUNGS: clear to auscultation and percussion with normal breathing effort HEART: regular rate & rhythm and no murmurs and no lower extremity edema ABDOMEN:abdomen soft, non-tender and normal bowel sounds Musculoskeletal:no cyanosis of digits and no clubbing  NEURO: alert & oriented x 3 with  fluent speech, no focal motor/sensory deficits  LABORATORY DATA:  I have reviewed the data as listed CBC Latest Ref Rng & Units 06/24/2018 04/22/2018 02/18/2018  WBC 4.0 - 10.5 K/uL 9.8 12.8(H) 7.3  Hemoglobin 12.0 - 15.0 g/dL 12.1 11.0(L) 13.6  Hematocrit 36.0 - 46.0 % 38.4 34.7(L) 41.2  Platelets 150 - 400 K/uL 301 330 258     CMP Latest Ref Rng & Units 02/28/2018 09/20/2017  10/10/2015  Glucose 65 - 99 mg/dL 144(H) 225(H) 92  BUN 6 - 24 mg/dL _0 Creatinine 0.57 - 1.00 mg/dL 0.55(L) 1.10(H) 0.60  Sodium 134 - 144 mmol/L 139 137 138  Potassium 3.5 - 5.2 mmol/L 4.3 4.1 4.1  Chloride 96 - 106 mmol/L 102 105 106  CO2 20 - 29 mmol/L _1 Calcium 8.7 - 10.2 mg/dL 9.3 8.8(L) 9.2  Total Protein 6.0 - 8.5 g/dL 7.1 6.8 -  Total Bilirubin 0.0 - 1.2 mg/dL <0.2 <0.2(L) -  Alkaline Phos 39 - 117 IU/L 73 79 -  AST 0 - 40 IU/L 17 24 -  ALT 0 - 32 IU/L 24 45(H) -      RADIOGRAPHIC STUDIES: I have personally reviewed the radiological images as listed and agreed with the findings in the report. No results found.   ASSESSMENT & PLAN:  Amy Maldonado is a 43 y.o. 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 She has only required 2 infusions in 2019  Last IV Feraheme 05/12/18. She responds to IV iron well. -We will continue monitoring closely.  She continues to have monthly periods with Menorrhagia the first few days.  -She is clinically doing well, asymptomatic. Labs reviewed, CBC WNL. Iron panel still pending. If levels low I will contact her to set up IV feraheme.  -Continue Ferrous sulfate TID  -I discussed she can increase chicken or fish in dish given she is diabetic.  -F/u in 9 months   2. Hypothyroidism -She's been on 25 mg Synthroid  -She is complaint and doing well.  3. Diabetes -Pt notes she was diagnosed with DM. Her last HbA1c on 02/28/18 at 6.1 -She is currently on Metformin.  -She will continue to follow up with her PCP.   4.Cancer screening -Continue age appropriate cancer screenings   Plan -Labs reviewed today, if iron levels low (ferritin<30 or low iron) will call her to set up IV feraheme.  -Labs in 3 and 6 months  -Lab and f/u in 9 months -Continue oral iron TID    No problem-specific Assessment & Plan notes found  for this encounter.   No orders of the defined types were placed in this encounter.  All questions were answered. The patient knows to call the clinic with any problems, questions or concerns. No barriers to learning was detected. I spent 10 minutes counseling the patient face to face. The total time spent in the appointment was 15 minutes and more than 50% was on counseling and review of test results     Truitt Merle, MD 06/24/2018   I, Joslyn Devon, am acting as scribe for Truitt Merle, MD.   I have reviewed the above documentation for accuracy and completeness, and I agree with the above.

## 2018-06-24 ENCOUNTER — Other Ambulatory Visit: Payer: Self-pay

## 2018-06-24 ENCOUNTER — Inpatient Hospital Stay: Payer: Self-pay | Attending: Hematology and Oncology | Admitting: Hematology

## 2018-06-24 ENCOUNTER — Inpatient Hospital Stay: Payer: Self-pay

## 2018-06-24 VITALS — BP 106/64 | HR 73 | Temp 98.5°F | Resp 17 | Ht 60.0 in | Wt 151.8 lb

## 2018-06-24 DIAGNOSIS — D500 Iron deficiency anemia secondary to blood loss (chronic): Secondary | ICD-10-CM

## 2018-06-24 DIAGNOSIS — E119 Type 2 diabetes mellitus without complications: Secondary | ICD-10-CM | POA: Insufficient documentation

## 2018-06-24 DIAGNOSIS — N920 Excessive and frequent menstruation with regular cycle: Secondary | ICD-10-CM | POA: Insufficient documentation

## 2018-06-24 DIAGNOSIS — E039 Hypothyroidism, unspecified: Secondary | ICD-10-CM | POA: Insufficient documentation

## 2018-06-24 LAB — CBC WITH DIFFERENTIAL (CANCER CENTER ONLY)
Abs Immature Granulocytes: 0.03 10*3/uL (ref 0.00–0.07)
Basophils Absolute: 0 10*3/uL (ref 0.0–0.1)
Basophils Relative: 0 %
Eosinophils Absolute: 0.1 10*3/uL (ref 0.0–0.5)
Eosinophils Relative: 1 %
HCT: 38.4 % (ref 36.0–46.0)
Hemoglobin: 12.1 g/dL (ref 12.0–15.0)
Immature Granulocytes: 0 %
Lymphocytes Relative: 22 %
Lymphs Abs: 2.2 10*3/uL (ref 0.7–4.0)
MCH: 30.3 pg (ref 26.0–34.0)
MCHC: 31.5 g/dL (ref 30.0–36.0)
MCV: 96 fL (ref 80.0–100.0)
Monocytes Absolute: 0.9 10*3/uL (ref 0.1–1.0)
Monocytes Relative: 9 %
Neutro Abs: 6.6 10*3/uL (ref 1.7–7.7)
Neutrophils Relative %: 68 %
Platelet Count: 301 10*3/uL (ref 150–400)
RBC: 4 MIL/uL (ref 3.87–5.11)
RDW: 15.3 % (ref 11.5–15.5)
WBC Count: 9.8 10*3/uL (ref 4.0–10.5)
nRBC: 0 % (ref 0.0–0.2)

## 2018-06-25 ENCOUNTER — Encounter: Payer: Self-pay | Admitting: Hematology

## 2018-06-27 ENCOUNTER — Telehealth: Payer: Self-pay | Admitting: Hematology

## 2018-06-27 LAB — IRON AND TIBC
Iron: 125 ug/dL (ref 41–142)
Saturation Ratios: 35 % (ref 21–57)
TIBC: 354 ug/dL (ref 236–444)
UIBC: 229 ug/dL (ref 120–384)

## 2018-06-27 LAB — FERRITIN: Ferritin: 40 ng/mL (ref 11–307)

## 2018-06-27 NOTE — Telephone Encounter (Signed)
Scheduled appt per 6/12 lops.  Printed and mailed calendar.

## 2018-07-01 ENCOUNTER — Telehealth: Payer: Self-pay | Admitting: Nurse Practitioner

## 2018-07-01 ENCOUNTER — Telehealth: Payer: Self-pay

## 2018-07-01 NOTE — Telephone Encounter (Signed)
Patient wanted to get clarification on her paperwork for the bank states that she only has 2 bank documents instead of 3.

## 2018-07-01 NOTE — Telephone Encounter (Signed)
Spoke with interpreter pt iron levels is good last week, no need iv iron for now. Pt lab result values were given and f/u appointment confirmed with interpereter

## 2018-07-01 NOTE — Telephone Encounter (Signed)
-----   Message from Truitt Merle, MD sent at 06/28/2018  9:04 AM EDT ----- Please let pt know through [place] interpreter that her iron level is good last week, no need iv iron for now, thanks  Truitt Merle  06/28/2018

## 2018-07-01 NOTE — Telephone Encounter (Signed)
Pt was called, and informed that she is missing the bank account all pages

## 2018-07-04 ENCOUNTER — Other Ambulatory Visit: Payer: Self-pay

## 2018-07-04 ENCOUNTER — Ambulatory Visit: Payer: Self-pay | Attending: Nurse Practitioner

## 2018-07-20 ENCOUNTER — Telehealth: Payer: Self-pay | Admitting: Nurse Practitioner

## 2018-07-20 ENCOUNTER — Other Ambulatory Visit: Payer: Self-pay | Admitting: Nurse Practitioner

## 2018-07-20 ENCOUNTER — Ambulatory Visit: Payer: Self-pay | Attending: Family Medicine

## 2018-07-20 ENCOUNTER — Other Ambulatory Visit: Payer: Self-pay

## 2018-07-20 DIAGNOSIS — E118 Type 2 diabetes mellitus with unspecified complications: Secondary | ICD-10-CM

## 2018-07-20 DIAGNOSIS — E039 Hypothyroidism, unspecified: Secondary | ICD-10-CM

## 2018-07-20 NOTE — Telephone Encounter (Signed)
Patient came in wanting to speak with you in regards to her OC please follow up.

## 2018-07-20 NOTE — Telephone Encounter (Signed)
Pt was informed tghat she has been approve for CAF and OC Rx, I will be sending a copy of her CAFA letter an she already has the Rx and OC card

## 2018-07-21 LAB — BASIC METABOLIC PANEL
BUN/Creatinine Ratio: 20 (ref 9–23)
BUN: 11 mg/dL (ref 6–24)
CO2: 19 mmol/L — ABNORMAL LOW (ref 20–29)
Calcium: 9 mg/dL (ref 8.7–10.2)
Chloride: 102 mmol/L (ref 96–106)
Creatinine, Ser: 0.55 mg/dL — ABNORMAL LOW (ref 0.57–1.00)
GFR calc Af Amer: 134 mL/min/{1.73_m2} (ref >59)
GFR calc non Af Amer: 116 mL/min/{1.73_m2} (ref >59)
Glucose: 196 mg/dL — ABNORMAL HIGH (ref 65–99)
Potassium: 4.6 mmol/L (ref 3.5–5.2)
Sodium: 137 mmol/L (ref 134–144)

## 2018-07-21 LAB — TSH: TSH: 5.52 u[IU]/mL — ABNORMAL HIGH (ref 0.450–4.500)

## 2018-07-21 LAB — HEMOGLOBIN A1C
Est. average glucose Bld gHb Est-mCnc: 157 mg/dL
Hgb A1c MFr Bld: 7.1 % — ABNORMAL HIGH (ref 4.8–5.6)

## 2018-07-25 ENCOUNTER — Other Ambulatory Visit: Payer: Self-pay | Admitting: Nurse Practitioner

## 2018-07-25 DIAGNOSIS — E119 Type 2 diabetes mellitus without complications: Secondary | ICD-10-CM

## 2018-07-25 DIAGNOSIS — E039 Hypothyroidism, unspecified: Secondary | ICD-10-CM

## 2018-07-25 MED ORDER — LEVOTHYROXINE SODIUM 50 MCG PO TABS: ORAL_TABLET | ORAL | 1 refills | Status: DC

## 2018-07-25 MED ORDER — ATORVASTATIN CALCIUM 20 MG PO TABS: 20.0000 mg | ORAL_TABLET | Freq: Every day | ORAL | 3 refills | Status: DC

## 2018-07-25 MED ORDER — METFORMIN HCL 500 MG PO TABS: 1000.0000 mg | ORAL_TABLET | Freq: Two times a day (BID) | ORAL | 3 refills | Status: DC

## 2018-07-26 ENCOUNTER — Telehealth: Payer: Self-pay | Admitting: Nurse Practitioner

## 2018-07-26 NOTE — Telephone Encounter (Signed)
Pt was calling because she wants to know her lab results, and she would also like to know why she was given cholesterol medication..please follow up

## 2018-07-28 NOTE — Progress Notes (Signed)
CMA spoke to patient to inform on results and Rx change.   Patient verified DOB. Pt. Understood.  Spanish interpreter assist with the call.

## 2018-07-28 NOTE — Telephone Encounter (Signed)
CMA called back patient to inform on lab results and medication changes.  Pt. Understood. Pt. Verified DOB.  Spanish interpreter assist with the call.

## 2018-08-06 ENCOUNTER — Other Ambulatory Visit: Payer: Self-pay | Admitting: Nurse Practitioner

## 2018-08-06 DIAGNOSIS — E039 Hypothyroidism, unspecified: Secondary | ICD-10-CM

## 2018-09-07 ENCOUNTER — Other Ambulatory Visit: Payer: Self-pay

## 2018-09-07 ENCOUNTER — Ambulatory Visit: Payer: Self-pay | Attending: Family Medicine

## 2018-09-07 DIAGNOSIS — E039 Hypothyroidism, unspecified: Secondary | ICD-10-CM

## 2018-09-08 LAB — TSH: TSH: 3.18 u[IU]/mL (ref 0.450–4.500)

## 2018-09-11 ENCOUNTER — Other Ambulatory Visit: Payer: Self-pay | Admitting: Nurse Practitioner

## 2018-09-11 DIAGNOSIS — E039 Hypothyroidism, unspecified: Secondary | ICD-10-CM

## 2018-09-11 DIAGNOSIS — E119 Type 2 diabetes mellitus without complications: Secondary | ICD-10-CM

## 2018-09-11 MED ORDER — LEVOTHYROXINE SODIUM 50 MCG PO TABS: ORAL_TABLET | ORAL | 3 refills | Status: DC

## 2018-09-11 MED ORDER — METFORMIN HCL 500 MG PO TABS: 1000.0000 mg | ORAL_TABLET | Freq: Two times a day (BID) | ORAL | 3 refills | Status: DC

## 2018-09-23 ENCOUNTER — Other Ambulatory Visit: Payer: Self-pay

## 2018-09-23 ENCOUNTER — Inpatient Hospital Stay: Payer: Self-pay | Attending: Hematology

## 2018-09-23 ENCOUNTER — Telehealth: Payer: Self-pay | Admitting: Hematology

## 2018-09-23 DIAGNOSIS — D500 Iron deficiency anemia secondary to blood loss (chronic): Secondary | ICD-10-CM | POA: Insufficient documentation

## 2018-09-23 DIAGNOSIS — Z23 Encounter for immunization: Secondary | ICD-10-CM | POA: Insufficient documentation

## 2018-09-23 DIAGNOSIS — N920 Excessive and frequent menstruation with regular cycle: Secondary | ICD-10-CM | POA: Insufficient documentation

## 2018-09-23 LAB — CBC WITH DIFFERENTIAL (CANCER CENTER ONLY)
Abs Immature Granulocytes: 0.02 10*3/uL (ref 0.00–0.07)
Basophils Absolute: 0 10*3/uL (ref 0.0–0.1)
Basophils Relative: 0 %
Eosinophils Absolute: 0.2 10*3/uL (ref 0.0–0.5)
Eosinophils Relative: 3 %
HCT: 31.6 % — ABNORMAL LOW (ref 36.0–46.0)
Hemoglobin: 10 g/dL — ABNORMAL LOW (ref 12.0–15.0)
Immature Granulocytes: 0 %
Lymphocytes Relative: 26 %
Lymphs Abs: 1.6 10*3/uL (ref 0.7–4.0)
MCH: 27.7 pg (ref 26.0–34.0)
MCHC: 31.6 g/dL (ref 30.0–36.0)
MCV: 87.5 fL (ref 80.0–100.0)
Monocytes Absolute: 0.5 10*3/uL (ref 0.1–1.0)
Monocytes Relative: 8 %
Neutro Abs: 3.9 10*3/uL (ref 1.7–7.7)
Neutrophils Relative %: 63 %
Platelet Count: 243 10*3/uL (ref 150–400)
RBC: 3.61 MIL/uL — ABNORMAL LOW (ref 3.87–5.11)
RDW: 14.3 % (ref 11.5–15.5)
WBC Count: 6.3 10*3/uL (ref 4.0–10.5)
nRBC: 0 % (ref 0.0–0.2)

## 2018-09-23 LAB — IRON AND TIBC
Iron: 19 ug/dL — ABNORMAL LOW (ref 41–142)
Saturation Ratios: 5 % — ABNORMAL LOW (ref 21–57)
TIBC: 369 ug/dL (ref 236–444)
UIBC: 350 ug/dL (ref 120–384)

## 2018-09-23 LAB — FERRITIN: Ferritin: 6 ng/mL — ABNORMAL LOW (ref 11–307)

## 2018-09-23 NOTE — Telephone Encounter (Signed)
Rescheduled appointment time on 12/11 per patient's request, translator will call and notify patient.

## 2018-09-26 ENCOUNTER — Telehealth: Payer: Self-pay

## 2018-09-26 NOTE — Telephone Encounter (Signed)
-----   Message from Truitt Merle, MD sent at 09/24/2018 10:04 AM EDT ----- Please let pt know her iron level is very low and she is mildly anemic again, I will send schedule message for iv iron twice in next few weeks, thanks   Truitt Merle  09/24/2018

## 2018-09-26 NOTE — Telephone Encounter (Signed)
Called Amy Maldonado in house spanish interpreter asked that she call the patient to let her know that her iron level is low and she is mildly anemic.  Per Dr. Burr Medico she is recommending IV iron weekly x 2 weeks, she will be receiving a call from scheduling with those appointments.

## 2018-09-27 ENCOUNTER — Telehealth: Payer: Self-pay | Admitting: Hematology

## 2018-09-27 NOTE — Telephone Encounter (Signed)
I used interpreter to call patient but could not reach so I will mail calendar

## 2018-10-05 ENCOUNTER — Other Ambulatory Visit: Payer: Self-pay

## 2018-10-05 ENCOUNTER — Inpatient Hospital Stay: Payer: Self-pay

## 2018-10-05 VITALS — BP 110/76 | HR 72 | Temp 98.7°F | Resp 18

## 2018-10-05 DIAGNOSIS — D500 Iron deficiency anemia secondary to blood loss (chronic): Secondary | ICD-10-CM

## 2018-10-05 MED ORDER — SODIUM CHLORIDE 0.9 % IV SOLN
510.0000 mg | Freq: Once | INTRAVENOUS | Status: AC
  Administered 2018-10-05: 510 mg via INTRAVENOUS
  Filled 2018-10-05: qty 17

## 2018-10-05 MED ORDER — SODIUM CHLORIDE 0.9 % IV SOLN
Freq: Once | INTRAVENOUS | Status: AC
  Administered 2018-10-05: 14:00:00 via INTRAVENOUS
  Filled 2018-10-05: qty 250

## 2018-10-05 NOTE — Patient Instructions (Signed)

## 2018-10-12 ENCOUNTER — Other Ambulatory Visit: Payer: Self-pay

## 2018-10-12 ENCOUNTER — Inpatient Hospital Stay: Payer: Self-pay

## 2018-10-12 VITALS — BP 111/71 | HR 71 | Temp 97.7°F | Resp 18

## 2018-10-12 DIAGNOSIS — Z23 Encounter for immunization: Secondary | ICD-10-CM

## 2018-10-12 DIAGNOSIS — D500 Iron deficiency anemia secondary to blood loss (chronic): Secondary | ICD-10-CM

## 2018-10-12 MED ORDER — SODIUM CHLORIDE 0.9 % IV SOLN
Freq: Once | INTRAVENOUS | Status: AC
  Administered 2018-10-12: 14:00:00 via INTRAVENOUS
  Filled 2018-10-12: qty 250

## 2018-10-12 MED ORDER — SODIUM CHLORIDE 0.9 % IV SOLN
510.0000 mg | Freq: Once | INTRAVENOUS | Status: AC
  Administered 2018-10-12: 14:00:00 510 mg via INTRAVENOUS
  Filled 2018-10-12: qty 510

## 2018-10-12 MED ORDER — INFLUENZA VAC SPLIT QUAD 0.5 ML IM SUSY
PREFILLED_SYRINGE | INTRAMUSCULAR | Status: AC
  Filled 2018-10-12: qty 0.5

## 2018-10-12 MED ORDER — INFLUENZA VAC SPLIT QUAD 0.5 ML IM SUSY
0.5000 mL | PREFILLED_SYRINGE | Freq: Once | INTRAMUSCULAR | Status: AC
  Administered 2018-10-12: 0.5 mL via INTRAMUSCULAR

## 2018-10-12 NOTE — Patient Instructions (Addendum)
Ferumoxytol injection What is this medicine? FERUMOXYTOL is an iron complex. Iron is used to make healthy red blood cells, which carry oxygen and nutrients throughout the body. This medicine is used to treat iron deficiency anemia in people with chronic kidney disease. This medicine may be used for other purposes; ask your health care provider or pharmacist if you have questions. COMMON BRAND NAME(S): Feraheme What should I tell my health care provider before I take this medicine? They need to know if you have any of these conditions: -anemia not caused by low iron levels -high levels of iron in the blood -magnetic resonance imaging (MRI) test scheduled -an unusual or allergic reaction to iron, other medicines, foods, dyes, or preservatives -pregnant or trying to get pregnant -breast-feeding How should I use this medicine? This medicine is for injection into a vein. It is given by a health care professional in a hospital or clinic setting. Talk to your pediatrician regarding the use of this medicine in children. Special care may be needed. Overdosage: If you think you have taken too much of this medicine contact a poison control center or emergency room at once. NOTE: This medicine is only for you. Do not share this medicine with others. What if I miss a dose? It is important not to miss your dose. Call your doctor or health care professional if you are unable to keep an appointment. What may interact with this medicine? This medicine may interact with the following medications: -other iron products This list may not describe all possible interactions. Give your health care provider a list of all the medicines, herbs, non-prescription drugs, or dietary supplements you use. Also tell them if you smoke, drink alcohol, or use illegal drugs. Some items may interact with your medicine. What should I watch for while using this medicine? Visit your doctor or healthcare professional regularly. Tell  your doctor or healthcare professional if your symptoms do not start to get better or if they get worse. You may need blood work done while you are taking this medicine. You may need to follow a special diet. Talk to your doctor. Foods that contain iron include: whole grains/cereals, dried fruits, beans, or peas, leafy green vegetables, and organ meats (liver, kidney). What side effects may I notice from receiving this medicine? Side effects that you should report to your doctor or health care professional as soon as possible: -allergic reactions like skin rash, itching or hives, swelling of the face, lips, or tongue -breathing problems -changes in blood pressure -feeling faint or lightheaded, falls -fever or chills -flushing, sweating, or hot feelings -swelling of the ankles or feet Side effects that usually do not require medical attention (report to your doctor or health care professional if they continue or are bothersome): -diarrhea -headache -nausea, vomiting -stomach pain This list may not describe all possible side effects. Call your doctor for medical advice about side effects. You may report side effects to FDA at 1-800-FDA-1088. Where should I keep my medicine? This drug is given in a hospital or clinic and will not be stored at home. NOTE: This sheet is a summary. It may not cover all possible information. If you have questions about this medicine, talk to your doctor, pharmacist, or health care provider.  2018 Elsevier/Gold Standard (2015-01-31 12:41:49) Influenza Virus Vaccine (Flucelvax) What is this medicine? INFLUENZA VIRUS VACCINE (in floo EN zuh VAHY ruhs vak SEEN) helps to reduce the risk of getting influenza also known as the flu. The vaccine only helps   protect you against some strains of the flu. This medicine may be used for other purposes; ask your health care provider or pharmacist if you have questions. COMMON BRAND NAME(S): FLUCELVAX What should I tell my health  care provider before I take this medicine? They need to know if you have any of these conditions:  bleeding disorder like hemophilia  fever or infection  Guillain-Barre syndrome or other neurological problems  immune system problems  infection with the human immunodeficiency virus (HIV) or AIDS  low blood platelet counts  multiple sclerosis  an unusual or allergic reaction to influenza virus vaccine, other medicines, foods, dyes or preservatives  pregnant or trying to get pregnant  breast-feeding How should I use this medicine? This vaccine is for injection into a muscle. It is given by a health care professional. A copy of Vaccine Information Statements will be given before each vaccination. Read this sheet carefully each time. The sheet may change frequently. Talk to your pediatrician regarding the use of this medicine in children. Special care may be needed. Overdosage: If you think you've taken too much of this medicine contact a poison control center or emergency room at once. Overdosage: If you think you have taken too much of this medicine contact a poison control center or emergency room at once. NOTE: This medicine is only for you. Do not share this medicine with others. What if I miss a dose? This does not apply. What may interact with this medicine?  chemotherapy or radiation therapy  medicines that lower your immune system like etanercept, anakinra, infliximab, and adalimumab  medicines that treat or prevent blood clots like warfarin  phenytoin  steroid medicines like prednisone or cortisone  theophylline  vaccines This list may not describe all possible interactions. Give your health care provider a list of all the medicines, herbs, non-prescription drugs, or dietary supplements you use. Also tell them if you smoke, drink alcohol, or use illegal drugs. Some items may interact with your medicine. What should I watch for while using this medicine? Report any  side effects that do not go away within 3 days to your doctor or health care professional. Call your health care provider if any unusual symptoms occur within 6 weeks of receiving this vaccine. You may still catch the flu, but the illness is not usually as bad. You cannot get the flu from the vaccine. The vaccine will not protect against colds or other illnesses that may cause fever. The vaccine is needed every year. What side effects may I notice from receiving this medicine? Side effects that you should report to your doctor or health care professional as soon as possible:  allergic reactions like skin rash, itching or hives, swelling of the face, lips, or tongue Side effects that usually do not require medical attention (Report these to your doctor or health care professional if they continue or are bothersome.):  fever  headache  muscle aches and pains  pain, tenderness, redness, or swelling at the injection site  tiredness This list may not describe all possible side effects. Call your doctor for medical advice about side effects. You may report side effects to FDA at 1-800-FDA-1088. Where should I keep my medicine? The vaccine will be given by a health care professional in a clinic, pharmacy, doctor's office, or other health care setting. You will not be given vaccine doses to store at home. NOTE: This sheet is a summary. It may not cover all possible information. If you have questions about   this medicine, talk to your doctor, pharmacist, or health care provider.  2020 Elsevier/Gold Standard (2010-12-10 14:06:47)  

## 2018-10-20 ENCOUNTER — Encounter: Payer: Self-pay | Admitting: Pharmacy Technician

## 2018-10-20 NOTE — Progress Notes (Unsigned)
The patient is approved for drug assistanc by [employer] for Feraheme.  Re-enrollment is effective until 10/20/18 and is based on self pay. Drug replacement for re-enrollment begins on DOS 10/05/18

## 2018-10-26 ENCOUNTER — Ambulatory Visit: Payer: Self-pay | Admitting: Nurse Practitioner

## 2018-11-07 ENCOUNTER — Other Ambulatory Visit: Payer: Self-pay

## 2018-11-07 ENCOUNTER — Encounter: Payer: Self-pay | Admitting: Nurse Practitioner

## 2018-11-07 ENCOUNTER — Ambulatory Visit: Payer: Self-pay | Attending: Nurse Practitioner | Admitting: Nurse Practitioner

## 2018-11-07 VITALS — BP 135/69 | HR 87 | Temp 99.1°F | Ht 60.0 in | Wt 155.0 lb

## 2018-11-07 DIAGNOSIS — E785 Hyperlipidemia, unspecified: Secondary | ICD-10-CM

## 2018-11-07 DIAGNOSIS — E119 Type 2 diabetes mellitus without complications: Secondary | ICD-10-CM

## 2018-11-07 DIAGNOSIS — E039 Hypothyroidism, unspecified: Secondary | ICD-10-CM

## 2018-11-07 LAB — GLUCOSE, POCT (MANUAL RESULT ENTRY): POC Glucose: 240 mg/dl — AB (ref 70–99)

## 2018-11-07 LAB — POCT GLYCOSYLATED HEMOGLOBIN (HGB A1C): Hemoglobin A1C: 9 % — AB (ref 4.0–5.6)

## 2018-11-07 MED ORDER — LEVOTHYROXINE SODIUM 50 MCG PO TABS: ORAL_TABLET | ORAL | 3 refills | Status: DC

## 2018-11-07 MED ORDER — TRUE METRIX BLOOD GLUCOSE TEST VI STRP: ORAL_STRIP | 12 refills | Status: DC

## 2018-11-07 MED ORDER — TRUEPLUS LANCETS 28G MISC: 3 refills | Status: DC

## 2018-11-07 MED ORDER — GLIMEPIRIDE 4 MG PO TABS: 4.0000 mg | ORAL_TABLET | Freq: Every day | ORAL | 3 refills | Status: DC

## 2018-11-07 MED ORDER — ATORVASTATIN CALCIUM 20 MG PO TABS: 20.0000 mg | ORAL_TABLET | Freq: Every day | ORAL | 3 refills | Status: DC

## 2018-11-07 MED ORDER — METFORMIN HCL 500 MG PO TABS: 1000.0000 mg | ORAL_TABLET | Freq: Two times a day (BID) | ORAL | 3 refills | Status: DC

## 2018-11-07 MED FILL — metFORMIN HCL 500 MG TABS: 500 | 30 days supply | Qty: 120 | Fill #0

## 2018-11-07 MED FILL — TRUE METRIX TEST STRIP: 100 days supply | Qty: 100 | Fill #0

## 2018-11-07 MED FILL — LEVOTHYROXINE 50 MCG TABLET: 50 | 30 days supply | Qty: 30 | Fill #0

## 2018-11-07 MED FILL — ATORVASTATIN CALCIUM 20 MG: 20 | 30 days supply | Qty: 30 | Fill #0

## 2018-11-07 MED FILL — GLIMEPIRIDE 4 MG TABS: 4 | 30 days supply | Qty: 30 | Fill #0

## 2018-11-07 MED FILL — TRUEplus LANCETS 28G MISC: 100 days supply | Qty: 100 | Fill #0

## 2018-11-07 NOTE — Patient Instructions (Signed)
Objetivos de azcar en sangre para la diabetes Ayuno en la maana antes del desayuno, lo que significa al menos 8 horas sin comer ni beber) excepto agua o caf o t sin azcar): 90-130  1 horas despus de las comidas: <170,  Hipoglucemia o niveles bajos de azcar en sangre: <70 (no debera tener hipoglucemia).  Trate de hacer 30 minutos de ejercicio la [employer]. Reconsidere lo que bebe. El agua es genial! Trate de consumir 2-3 opciones de carbohidratos por comida (30-45 gramos) +/- 1 de cualquier manera  Apunta a consumir de 0 a 15 carbohidratos por bocadillo si tienes hambre Incluya protenas con moderacin en sus comidas y refrigerios Tourist information centre manager las etiquetas de los alimentos para Civil engineer, contracting el total de carbohidratos y gramos de grasa de los alimentos Considere controlar la glucosa en sangre en horarios alternos al da Contine tomando los medicamentos segn las indicaciones Tenga en cuenta la cantidad de azcar que agrega a las bebidas y otros alimentos. Ponche de frutas: encuentra uno sin azcar Mide y disminuye las porciones de alimentos con carbohidratos. Haz tu plato y no vuelvas ni un segundo

## 2018-11-07 NOTE — Progress Notes (Signed)
Assessment & Plan:  Amy Maldonado was seen today for follow-up.  Diagnoses and all orders for this visit:  Controlled type 2 diabetes mellitus without complication, without long-term current use of insulin (HCC) -     CMP14+EGFR -     HgB A1c -     Glucose (CBG) -     metFORMIN (GLUCOPHAGE) 500 MG tablet; Take 2 tablets (1,000 mg total) by mouth 2 (two) times daily with a meal. -     glucose blood (TRUE METRIX BLOOD GLUCOSE TEST) test strip; Use as instructed. Monitor blood glucose levels once a day by fingerstick. -     TRUEplus Lancets 28G MISC; Use as instructed. Monitor blood glucose levels once a day by fingerstick.  Hyperlipidemia LDL goal <70 -     atorvastatin (LIPITOR) 20 MG tablet; Take 1 tablet (20 mg total) by mouth daily.  Hypothyroidism, unspecified type -     Discontinue: levothyroxine (SYNTHROID) 50 MCG tablet; TAKE 1 TABLET BY MOUTH DAILY BEFORE BREAKFAST -     levothyroxine (SYNTHROID) 50 MCG tablet; TAKE 1 TABLET BY MOUTH DAILY BEFORE BREAKFAST    Patient has been counseled on age-appropriate routine health concerns for screening and prevention. These are reviewed and up-to-date. Referrals have been placed accordingly. Immunizations are up-to-date or declined.    Subjective:   Chief Complaint  Patient presents with  . Follow-up    Pt. is here to follow up on Diabetes.    HPI Amy Maldonado 43 y.o. female presents to office today for diabetes follow up.  has a past medical history of Anemia, Diabetes mellitus without complication ([place]), and Hypothyroidism (2011).   DM TYPE 2 Current medications include metformin 1000 mg BID. She has not taken metformin in 2 days.  LDL not at goal <70. Blood pressure is well controlled today. Diabetes is not well controlled.  Will have to add glimiperide 4 mg. Weight is up 10 lbs since February. She is not checking her blood sugars at home consistently. I have recommended she start checking her blood glucose levels at least  twice per day. Also discussed dietary and exercise modifications today.  Lab Results  Component Value Date   HGBA1C 9.0 (A) 11/07/2018   Lab Results  Component Value Date   LDLCALC 112 (H) 02/28/2018   BP Readings from Last 3 Encounters:  11/07/18 135/69  10/12/18 111/71  10/05/18 110/76    Hypothyroidism: Patient presents for evaluation of thyroid function. She denies fatigue, weight changes, heat/cold intolerance, bowel/skin changes or CVS symptoms. She endorses taking synthroid 50 mg daily as prescribed.  Lab Results  Component Value Date   TSH 3.180 09/07/2018   Review of Systems  Constitutional: Negative for fever, malaise/fatigue and weight loss.  HENT: Negative.  Negative for nosebleeds.   Eyes: Negative.  Negative for blurred vision, double vision and photophobia.  Respiratory: Negative.  Negative for cough and shortness of breath.   Cardiovascular: Negative.  Negative for chest pain, palpitations and leg swelling.  Gastrointestinal: Negative.  Negative for heartburn, nausea and vomiting.  Musculoskeletal: Negative.  Negative for myalgias.  Neurological: Negative.  Negative for dizziness, focal weakness, seizures and headaches.  Psychiatric/Behavioral: Negative.  Negative for suicidal ideas.    Past Medical History:  Diagnosis Date  . Anemia   . Diabetes mellitus without complication ([place])   . Hypothyroidism 2011    Past Surgical History:  Procedure Laterality Date  . CESAREAN SECTION     3 c section  Family History  Problem Relation Age of Onset  . Hypertension Mother   . Diabetes Father   . Hypertension Father   . Cancer Father        prostate cancer    Social History Reviewed with no changes to be made today.   Outpatient Medications Prior to Visit  Medication Sig Dispense Refill  . Blood Glucose Monitoring Suppl (TRUE METRIX METER) w/Device KIT Use as instructed. 1 kit 0  . ferrous sulfate 325 (65 FE) MG tablet Take 1 tablet (325 mg total) by  mouth 3 (three) times daily with meals. 180 tablet 3  . atorvastatin (LIPITOR) 20 MG tablet Take 1 tablet (20 mg total) by mouth daily. 90 tablet 3  . glucose blood (TRUE METRIX BLOOD GLUCOSE TEST) test strip Use as instructed. Monitor blood glucose levels once a day by fingerstick. 100 each 12  . levothyroxine (SYNTHROID) 50 MCG tablet TAKE 1 TABLET BY MOUTH DAILY BEFORE BREAKFAST 30 tablet 3  . TRUEPLUS LANCETS 28G MISC Use as instructed. Monitor blood glucose levels once a day by fingerstick. 100 each 3  . metFORMIN (GLUCOPHAGE) 500 MG tablet Take 2 tablets (1,000 mg total) by mouth 2 (two) times daily with a meal. 120 tablet 3   No facility-administered medications prior to visit.     No Known Allergies     Objective:    BP 135/69 (BP Location: Right Arm, Patient Position: Sitting, Cuff Size: Normal)   Pulse 87   Temp 99.1 F (37.3 C) (Oral)   Ht 5' (1.524 m)   Wt 155 lb (70.3 kg)   SpO2 99%   BMI 30.27 kg/m  Wt Readings from Last 3 Encounters:  11/07/18 155 lb (70.3 kg)  06/24/18 151 lb 12.8 oz (68.9 kg)  02/28/18 145 lb 3.2 oz (65.9 kg)    Physical Exam Vitals signs and nursing note reviewed.  Constitutional:      Appearance: She is well-developed.  HENT:     Head: Normocephalic and atraumatic.  Neck:     Musculoskeletal: Normal range of motion.  Cardiovascular:     Rate and Rhythm: Normal rate and regular rhythm.     Heart sounds: Normal heart sounds. No murmur. No friction rub. No gallop.   Pulmonary:     Effort: Pulmonary effort is normal. No tachypnea or respiratory distress.     Breath sounds: Normal breath sounds. No decreased breath sounds, wheezing, rhonchi or rales.  Chest:     Chest wall: No tenderness.  Abdominal:     General: Bowel sounds are normal.     Palpations: Abdomen is soft.  Musculoskeletal: Normal range of motion.  Skin:    General: Skin is warm and dry.  Neurological:     Mental Status: She is alert and oriented to person, place, and  time.     Coordination: Coordination normal.  Psychiatric:        Behavior: Behavior normal. Behavior is cooperative.        Thought Content: Thought content normal.        Judgment: Judgment normal.          Patient has been counseled extensively about nutrition and exercise as well as the importance of adherence with medications and regular follow-up. The patient was given clear instructions to go to ER or return to medical center if symptoms don't improve, worsen or new problems develop. The patient verbalized understanding.   Follow-up: Return in about 3 months (around 02/07/2019) for TSH, A1c.  Gildardo Pounds, FNP-BC [facility] and [place], [place]   11/07/2018, 4:26 PM

## 2018-11-08 LAB — CMP14+EGFR
ALT: 46 IU/L — ABNORMAL HIGH (ref 0–32)
AST: 28 IU/L (ref 0–40)
Albumin/Globulin Ratio: 1.7 (ref 1.2–2.2)
Albumin: 4.3 g/dL (ref 3.8–4.8)
Alkaline Phosphatase: 97 IU/L (ref 39–117)
BUN/Creatinine Ratio: 15 (ref 9–23)
BUN: 12 mg/dL (ref 6–24)
Bilirubin Total: <0.2 mg/dL (ref 0.0–1.2)
CO2: 21 mmol/L (ref 20–29)
Calcium: 9.6 mg/dL (ref 8.7–10.2)
Chloride: 103 mmol/L (ref 96–106)
Creatinine, Ser: 0.79 mg/dL (ref 0.57–1.00)
GFR calc Af Amer: 107 mL/min/{1.73_m2} (ref >59)
GFR calc non Af Amer: 93 mL/min/{1.73_m2} (ref >59)
Globulin, Total: 2.6 g/dL (ref 1.5–4.5)
Glucose: 229 mg/dL — ABNORMAL HIGH (ref 65–99)
Potassium: 3.9 mmol/L (ref 3.5–5.2)
Sodium: 136 mmol/L (ref 134–144)
Total Protein: 6.9 g/dL (ref 6.0–8.5)

## 2018-12-05 ENCOUNTER — Encounter: Payer: Self-pay | Admitting: Pharmacist

## 2018-12-05 ENCOUNTER — Other Ambulatory Visit: Payer: Self-pay

## 2018-12-05 ENCOUNTER — Ambulatory Visit: Payer: Self-pay | Attending: Nurse Practitioner | Admitting: Pharmacist

## 2018-12-05 DIAGNOSIS — E119 Type 2 diabetes mellitus without complications: Secondary | ICD-10-CM

## 2018-12-05 MED FILL — LEVOTHYROXINE 50 MCG TABLET: 50 | 30 days supply | Qty: 30 | Fill #1

## 2018-12-05 MED FILL — GLIMEPIRIDE 4 MG TABS: 4 | 30 days supply | Qty: 30 | Fill #1

## 2018-12-05 MED FILL — ATORVASTATIN CALCIUM 20 MG: 20 | 30 days supply | Qty: 30 | Fill #1

## 2018-12-05 NOTE — Patient Instructions (Signed)
Thank you for coming to see me today. Please do the following:  1. Continue current regimen.  2. Continue checking blood sugars at home. 3. Continue making the lifestyle changes we've discussed together during our visit. Diet and exercise play a significant role in improving your blood sugars.  4. Follow-up with me in 1 month.    Hypoglycemia or low blood sugar:   Low blood sugar can happen quickly and may become an emergency if not treated right away.   While this shouldn't happen often, it can be brought upon if you skip a meal or do not eat enough. Also, if your insulin or other diabetes medications are dosed too high, this can cause your blood sugar to go to low.   Warning signs of low blood sugar include: 1. Feeling shaky or dizzy 2. Feeling weak or tired  3. Excessive hunger 4. Feeling anxious or upset  5. Sweating even when you aren't exercising  What to do if I experience low blood sugar? 1. Check your blood sugar with your meter. If lower than 70, proceed to step 2.  2. Treat with 3-4 glucose tablets or 3 packets of regular sugar. If these aren't around, you can try hard candy. Yet another option would be to drink 4 ounces of fruit juice or 6 ounces of REGULAR soda.  3. Re-check your sugar in 15 minutes. If it is still below 70, do what you did in step 2 again. If has come back up, go ahead and eat a snack or small meal at this time.

## 2018-12-05 NOTE — Progress Notes (Signed)
    S:    PCP: Zelda   No chief complaint on file.  Patient arrives in good spirits.  Presents for diabetes evaluation, education, and management Patient was referred and last seen by Primary Care Provider on 11/07/18. Glimepiride was added at her last visit.     Family/Social History:  - FHx: HTN (mother, father), DM (father) - Never smoker - Denies alcohol use   Insurance coverage/medication affordability: self-pay  Patient reports adherence with medications.  Current diabetes medications include: metformin 1000 mg BID (takes two 500 mg tabs BID); glimepiride 4 mg daily  Current hypertension medications include: none  Current hyperlipidemia medications include: atorvastatin 20 mg daily   Patient denies hypoglycemic events.  Patient reported dietary habits:  - Pt reports decreasing rice, tortillas - She reports eliminating sugar from her diet  Patient-reported exercise habits:  - dances 5 days/week   Patient denies nocturia.   Patient denies neuropathy.  Patient denies visual changes. Patient reports self foot exams.    O:  POCT glucose: 108   Lab Results  Component Value Date   HGBA1C 9.0 (A) 11/07/2018   There were no vitals filed for this visit.  Lipid Panel     Component Value Date/Time   CHOL 183 02/28/2018 0914   TRIG 107 02/28/2018 0914   HDL 50 02/28/2018 0914   CHOLHDL 3.7 02/28/2018 0914   CHOLHDL 4.1 10/01/2017 0905   VLDL 20 10/01/2017 0905   LDLCALC 112 (H) 02/28/2018 0914   Home fasting blood sugars: 110s-120s  2 hour post-meal/random blood sugars: 140s. She did not bring her meter. These values are reported.   Clinical Atherosclerotic Cardiovascular Disease (ASCVD): No  The 10-year ASCVD risk score Mikey Bussing DC Jr., et al., 2013) is: 1.5%   Values used to calculate the score:     Age: 43 years     Sex: Female     Is Non-Hispanic African American: No     Diabetic: Yes     Tobacco smoker: No     Systolic Blood Pressure: [id number] mmHg     Is BP  treated: No     HDL Cholesterol: 50 mg/dL     Total Cholesterol: 183 mg/dL   A/P: Diabetes longstanding currently uncontrolled. Patient is able to verbalize appropriate hypoglycemia management plan. Patient is adherent with medication. Her reported sugars are much improved and she reports increased dietary and exercise compliance. Will make no changes and see her in 1 month for recheck.  -Continued current regimen.  -Extensively discussed pathophysiology of diabetes, recommended lifestyle interventions, dietary effects on blood sugar control -Counseled on s/sx of and management of hypoglycemia -Next A1C anticipated 01/2019.   ASCVD risk - primary prevention in patient with diabetes. Last LDL is not controlled. ASCVD risk score is not >20%  - moderate intensity statin indicated. -Continued atorvastatin 20 mg.   HM: UTD on influenza and tetanus vaccines. Pneumovax indicated.  - Will address at follow-up.   Written patient instructions provided.  Total time in face to face counseling 30 minutes.   Follow up Pharmacist Visit in 1 month.  Benard Halsted, PharmD, [place] [phone]

## 2018-12-23 ENCOUNTER — Inpatient Hospital Stay: Payer: Self-pay

## 2018-12-23 ENCOUNTER — Other Ambulatory Visit: Payer: Self-pay

## 2018-12-28 ENCOUNTER — Other Ambulatory Visit: Payer: Self-pay

## 2018-12-28 ENCOUNTER — Inpatient Hospital Stay: Payer: Self-pay | Attending: Hematology

## 2018-12-28 DIAGNOSIS — D500 Iron deficiency anemia secondary to blood loss (chronic): Secondary | ICD-10-CM | POA: Insufficient documentation

## 2018-12-28 DIAGNOSIS — N920 Excessive and frequent menstruation with regular cycle: Secondary | ICD-10-CM | POA: Insufficient documentation

## 2018-12-28 LAB — CBC WITH DIFFERENTIAL (CANCER CENTER ONLY)
Abs Immature Granulocytes: 0.03 10*3/uL (ref 0.00–0.07)
Basophils Absolute: 0 10*3/uL (ref 0.0–0.1)
Basophils Relative: 0 %
Eosinophils Absolute: 0.1 10*3/uL (ref 0.0–0.5)
Eosinophils Relative: 1 %
HCT: 39.3 % (ref 36.0–46.0)
Hemoglobin: 12.9 g/dL (ref 12.0–15.0)
Immature Granulocytes: 0 %
Lymphocytes Relative: 20 %
Lymphs Abs: 2.1 10*3/uL (ref 0.7–4.0)
MCH: 30.6 pg (ref 26.0–34.0)
MCHC: 32.8 g/dL (ref 30.0–36.0)
MCV: 93.3 fL (ref 80.0–100.0)
Monocytes Absolute: 1.1 10*3/uL — ABNORMAL HIGH (ref 0.1–1.0)
Monocytes Relative: 11 %
Neutro Abs: 7 10*3/uL (ref 1.7–7.7)
Neutrophils Relative %: 68 %
Platelet Count: 301 10*3/uL (ref 150–400)
RBC: 4.21 MIL/uL (ref 3.87–5.11)
RDW: 14.6 % (ref 11.5–15.5)
WBC Count: 10.4 10*3/uL (ref 4.0–10.5)
nRBC: 0 % (ref 0.0–0.2)

## 2018-12-29 LAB — IRON AND TIBC
Iron: 34 ug/dL — ABNORMAL LOW (ref 41–142)
Saturation Ratios: 10 % — ABNORMAL LOW (ref 21–57)
TIBC: 345 ug/dL (ref 236–444)
UIBC: 311 ug/dL (ref 120–384)

## 2018-12-29 LAB — FERRITIN: Ferritin: 60 ng/mL (ref 11–307)

## 2019-01-03 ENCOUNTER — Telehealth: Payer: Self-pay

## 2019-01-03 NOTE — Telephone Encounter (Signed)
-----   Message from Truitt Merle, MD sent at 01/02/2019 11:32 AM EST ----- Please let her know the lab results, iron level good, no need iv iron now, continue oral iron, thanks   Truitt Merle 01/02/2019

## 2019-01-03 NOTE — Telephone Encounter (Signed)
Amy Maldonado (in house spanish interpreter) will call patient to let her know her lab results, iron level is good, no need for IV iron at present, instructed her to continue oral iron.

## 2019-01-04 ENCOUNTER — Ambulatory Visit: Payer: Self-pay | Admitting: Pharmacist

## 2019-01-20 ENCOUNTER — Other Ambulatory Visit: Payer: Self-pay

## 2019-01-20 ENCOUNTER — Ambulatory Visit: Payer: Self-pay | Attending: Nurse Practitioner

## 2019-01-20 MED FILL — ?ATORVASTATIN 20 MG TABLET: 20 | 30 days supply | Qty: 30 | Fill #2

## 2019-01-20 MED FILL — GLIMEPIRIDE 4 MG TABS: 4 | 30 days supply | Qty: 30 | Fill #2

## 2019-01-20 MED FILL — LEVOTHYROXINE 50 MCG TABLET: 50 | 30 days supply | Qty: 30 | Fill #2

## 2019-02-08 ENCOUNTER — Ambulatory Visit: Payer: Self-pay | Admitting: Nurse Practitioner

## 2019-02-15 MED FILL — GLIMEPIRIDE 4 MG TABS: 4 | 30 days supply | Qty: 30 | Fill #3

## 2019-02-15 MED FILL — LEVOTHYROXINE 50 MCG TABLET: 50 | 30 days supply | Qty: 30 | Fill #3

## 2019-02-23 ENCOUNTER — Other Ambulatory Visit (HOSPITAL_COMMUNITY): Payer: Self-pay

## 2019-02-23 DIAGNOSIS — Z1231 Encounter for screening mammogram for malignant neoplasm of breast: Secondary | ICD-10-CM

## 2019-03-08 ENCOUNTER — Other Ambulatory Visit: Payer: Self-pay | Admitting: Nurse Practitioner

## 2019-03-08 DIAGNOSIS — E039 Hypothyroidism, unspecified: Secondary | ICD-10-CM

## 2019-03-09 ENCOUNTER — Ambulatory Visit: Payer: Self-pay | Attending: Nurse Practitioner | Admitting: Nurse Practitioner

## 2019-03-09 ENCOUNTER — Other Ambulatory Visit: Payer: Self-pay

## 2019-03-09 ENCOUNTER — Encounter: Payer: Self-pay | Admitting: Nurse Practitioner

## 2019-03-09 VITALS — BP 116/71 | HR 89 | Temp 98.4°F | Ht 60.0 in | Wt 156.0 lb

## 2019-03-09 DIAGNOSIS — D509 Iron deficiency anemia, unspecified: Secondary | ICD-10-CM

## 2019-03-09 DIAGNOSIS — E119 Type 2 diabetes mellitus without complications: Secondary | ICD-10-CM

## 2019-03-09 DIAGNOSIS — E785 Hyperlipidemia, unspecified: Secondary | ICD-10-CM

## 2019-03-09 DIAGNOSIS — E039 Hypothyroidism, unspecified: Secondary | ICD-10-CM

## 2019-03-09 LAB — POCT GLYCOSYLATED HEMOGLOBIN (HGB A1C): Hemoglobin A1C: 7.2 % — AB (ref 4.0–5.6)

## 2019-03-09 LAB — GLUCOSE, POCT (MANUAL RESULT ENTRY): POC Glucose: 118 mg/dl — AB (ref 70–99)

## 2019-03-09 MED ORDER — TRUEPLUS LANCETS 28G MISC: 3 refills | Status: DC

## 2019-03-09 MED ORDER — ATORVASTATIN CALCIUM 20 MG PO TABS: 20.0000 mg | ORAL_TABLET | Freq: Every day | ORAL | 3 refills | Status: DC

## 2019-03-09 MED ORDER — FERROUS SULFATE 325 (65 FE) MG PO TABS: 325.0000 mg | ORAL_TABLET | Freq: Three times a day (TID) | ORAL | 3 refills | Status: DC

## 2019-03-09 MED ORDER — GLIMEPIRIDE 4 MG PO TABS: 4.0000 mg | ORAL_TABLET | Freq: Every day | ORAL | 3 refills | Status: DC

## 2019-03-09 MED ORDER — TRUE METRIX BLOOD GLUCOSE TEST VI STRP: ORAL_STRIP | 12 refills | Status: DC

## 2019-03-09 MED ORDER — METFORMIN HCL 500 MG PO TABS: 1000.0000 mg | ORAL_TABLET | Freq: Two times a day (BID) | ORAL | 3 refills | Status: DC

## 2019-03-09 NOTE — Progress Notes (Signed)
Assessment & Plan:  Amy Maldonado was seen today for follow-up.  Diagnoses and all orders for this visit:  Controlled type 2 diabetes mellitus without complication, without long-term current use of insulin (HCC) -     Microalbumin/Creatinine Ratio, Urine -     Glucose (CBG) -     HgB A1c -     Basic metabolic panel Continue blood sugar control as discussed in office today, low carbohydrate diet, and regular physical exercise as tolerated, 150 minutes per week (30 min each day, 5 days per week, or 50 min 3 days per week). Keep blood sugar logs with fasting goal of 90-130 mg/dl, post prandial (after you eat) less than 180.  For Hypoglycemia: BS <60 and Hyperglycemia BS >400; contact the clinic ASAP. Annual eye exams and foot exams are recommended.  Hypothyroidism, unspecified type -     TSH  Dyslipidemia, goal LDL below 70 -     Lipid panel INSTRUCTIONS: Work on a low fat, heart healthy diet and participate in regular aerobic exercise program by working out at least 150 minutes per week; 5 days a week-30 minutes per day. Avoid red meat/beef/steak,  fried foods. junk foods, sodas, sugary drinks, unhealthy snacking, alcohol and smoking.  Drink at least 80 oz of water per day and monitor your carbohydrate intake daily.    Patient has been counseled on age-appropriate routine health concerns for screening and prevention. These are reviewed and up-to-date. Referrals have been placed accordingly. Immunizations are up-to-date or declined.    Subjective:   Chief Complaint  Patient presents with  . Follow-up    Pt. is here for 3 months follow up for diabeetes.    HPI Gregoria Selvy 44 y.o. female presents to office today for follow up.  has a past medical history of Anemia, Diabetes mellitus without complication ([place]), and Hypothyroidism (2011).   DM TYPE 2 Well controlled. Down from 9.0 to 7.2. Post prandial 150-180s. I have given her a goal to lose 5 lbs by her next office visit.  Taking glimepiride 4 mg daily and metformin 1000 mg BID as prescribed. Denies any hypoglycemic or hyperglycemic symptoms. Monitoring her blood glucose levels daily. Average readings fasting 120-130s.  Lab Results  Component Value Date   HGBA1C 7.2 (A) 03/09/2019   Lab Results  Component Value Date   HGBA1C 9.0 (A) 11/07/2018     Dyslipidemia LDL not at goal of <70. Taking atorvastatin 20 mg daily as prescribed. Denies any statin intolerance or myalgias.  Lab Results  Component Value Date   LDLCALC 112 (H) 02/28/2018    Hypothyroidism Taking synthroid 50 mcg daily as prescribed. Patient denies change in energy level, diarrhea, heat / cold intolerance, nervousness, palpitations and weight changes. Lab Results  Component Value Date   TSH 3.180 09/07/2018   Review of Systems  Constitutional: Negative for fever, malaise/fatigue and weight loss.  HENT: Negative.  Negative for nosebleeds.   Eyes: Negative.  Negative for blurred vision, double vision and photophobia.  Respiratory: Negative.  Negative for cough and shortness of breath.   Cardiovascular: Negative.  Negative for chest pain, palpitations and leg swelling.  Gastrointestinal: Negative.  Negative for heartburn, nausea and vomiting.  Musculoskeletal: Negative.  Negative for myalgias.  Neurological: Negative.  Negative for dizziness, focal weakness, seizures and headaches.  Psychiatric/Behavioral: Negative.  Negative for suicidal ideas.    Past Medical History:  Diagnosis Date  . Anemia   . Diabetes mellitus without complication ([place])   . Hypothyroidism  2011    Past Surgical History:  Procedure Laterality Date  . CESAREAN SECTION     3 c section    Family History  Problem Relation Age of Onset  . Hypertension Mother   . Diabetes Father   . Hypertension Father   . Cancer Father        prostate cancer    Social History Reviewed with no changes to be made today.   Outpatient Medications Prior to Visit    Medication Sig Dispense Refill  . Blood Glucose Monitoring Suppl (TRUE METRIX METER) w/Device KIT Use as instructed. 1 kit 0  . levothyroxine (SYNTHROID) 50 MCG tablet TAKE 1 TABLET BY MOUTH DAILY BEFORE BREAKFAST 30 tablet 3  . atorvastatin (LIPITOR) 20 MG tablet Take 1 tablet (20 mg total) by mouth daily. 90 tablet 3  . glimepiride (AMARYL) 4 MG tablet Take 1 tablet (4 mg total) by mouth daily before breakfast. 30 tablet 3  . glucose blood (TRUE METRIX BLOOD GLUCOSE TEST) test strip Use as instructed. Monitor blood glucose levels once a day by fingerstick. 100 each 12  . TRUEplus Lancets 28G MISC Use as instructed. Monitor blood glucose levels once a day by fingerstick. 100 each 3  . ferrous sulfate 325 (65 FE) MG tablet Take 1 tablet (325 mg total) by mouth 3 (three) times daily with meals. (Patient not taking: Reported on 03/09/2019) 180 tablet 3  . metFORMIN (GLUCOPHAGE) 500 MG tablet Take 2 tablets (1,000 mg total) by mouth 2 (two) times daily with a meal. 120 tablet 3   No facility-administered medications prior to visit.    No Known Allergies     Objective:    BP 116/71 (BP Location: Left Arm, Patient Position: Sitting, Cuff Size: Normal)   Pulse 89   Temp 98.4 F (36.9 C) (Temporal)   Ht 5' (1.524 m)   Wt 156 lb (70.8 kg)   SpO2 99%   BMI 30.47 kg/m  Wt Readings from Last 3 Encounters:  03/09/19 156 lb (70.8 kg)  11/07/18 155 lb (70.3 kg)  06/24/18 151 lb 12.8 oz (68.9 kg)    Physical Exam Vitals and nursing note reviewed.  Constitutional:      Appearance: She is well-developed.  HENT:     Head: Normocephalic and atraumatic.  Cardiovascular:     Rate and Rhythm: Normal rate and regular rhythm.     Heart sounds: Normal heart sounds. No murmur. No friction rub. No gallop.   Pulmonary:     Effort: Pulmonary effort is normal. No tachypnea or respiratory distress.     Breath sounds: Normal breath sounds. No decreased breath sounds, wheezing, rhonchi or rales.  Chest:      Chest wall: No tenderness.  Abdominal:     General: Bowel sounds are normal.     Palpations: Abdomen is soft.  Musculoskeletal:        General: Normal range of motion.     Cervical back: Normal range of motion.  Skin:    General: Skin is warm and dry.  Neurological:     Mental Status: She is alert and oriented to person, place, and time.     Coordination: Coordination normal.  Psychiatric:        Behavior: Behavior normal. Behavior is cooperative.        Thought Content: Thought content normal.        Judgment: Judgment normal.          Patient has been counseled extensively about nutrition  and exercise as well as the importance of adherence with medications and regular follow-up. The patient was given clear instructions to go to ER or return to medical center if symptoms don't improve, worsen or new problems develop. The patient verbalized understanding.   Follow-up: Return in about 3 months (around 06/06/2019).   Gildardo Pounds, FNP-BC [facility] and [place], [place]   03/09/2019, 7:18 PM

## 2019-03-10 ENCOUNTER — Other Ambulatory Visit: Payer: Self-pay | Admitting: Nurse Practitioner

## 2019-03-10 ENCOUNTER — Ambulatory Visit: Payer: Self-pay | Admitting: Nurse Practitioner

## 2019-03-10 DIAGNOSIS — E039 Hypothyroidism, unspecified: Secondary | ICD-10-CM

## 2019-03-10 LAB — LIPID PANEL
Chol/HDL Ratio: 2.2 ratio (ref 0.0–4.4)
Cholesterol, Total: 128 mg/dL (ref 100–199)
HDL: 58 mg/dL (ref >39)
LDL Chol Calc (NIH): 56 mg/dL (ref 0–99)
Triglycerides: 71 mg/dL (ref 0–149)
VLDL Cholesterol Cal: 14 mg/dL (ref 5–40)

## 2019-03-10 LAB — BASIC METABOLIC PANEL
BUN/Creatinine Ratio: 17 (ref 9–23)
BUN: 9 mg/dL (ref 6–24)
CO2: 22 mmol/L (ref 20–29)
Calcium: 9.6 mg/dL (ref 8.7–10.2)
Chloride: 103 mmol/L (ref 96–106)
Creatinine, Ser: 0.52 mg/dL — ABNORMAL LOW (ref 0.57–1.00)
GFR calc Af Amer: 135 mL/min/{1.73_m2} (ref >59)
GFR calc non Af Amer: 117 mL/min/{1.73_m2} (ref >59)
Glucose: 108 mg/dL — ABNORMAL HIGH (ref 65–99)
Potassium: 4.3 mmol/L (ref 3.5–5.2)
Sodium: 138 mmol/L (ref 134–144)

## 2019-03-10 LAB — TSH: TSH: 0.417 u[IU]/mL — ABNORMAL LOW (ref 0.450–4.500)

## 2019-03-10 LAB — MICROALBUMIN / CREATININE URINE RATIO
Creatinine, Urine: 64.4 mg/dL
Microalb/Creat Ratio: 36 mg/g creat — ABNORMAL HIGH (ref 0–29)
Microalbumin, Urine: 23.4 ug/mL

## 2019-03-10 MED ORDER — LISINOPRIL 2.5 MG PO TABS: 2.5000 mg | ORAL_TABLET | Freq: Every day | ORAL | 1 refills | Status: DC

## 2019-03-10 MED ORDER — LEVOTHYROXINE SODIUM 25 MCG PO TABS: 25.0000 ug | ORAL_TABLET | Freq: Every day | ORAL | 1 refills | Status: DC

## 2019-03-10 MED FILL — FERROUS SULFATE 325 MG TAB: 325 (65 FE) | 30 days supply | Qty: 90 | Fill #0

## 2019-03-10 MED FILL — ?METFORMIN HCL 500MG TABLET: 500 | 30 days supply | Qty: 120 | Fill #0

## 2019-03-10 MED FILL — ?ATORVASTATIN 20 MG TABLET: 20 | 30 days supply | Qty: 30 | Fill #0

## 2019-03-10 MED FILL — TRUEplus LANCETS 28G MISC: 90 days supply | Qty: 100 | Fill #0

## 2019-03-10 MED FILL — TRUE METRIX TEST STRIP: 90 days supply | Qty: 100 | Fill #0

## 2019-03-13 MED FILL — LISINOPRIL 2.5 MG TABLET: 2.5 | 30 days supply | Qty: 30 | Fill #0

## 2019-03-23 ENCOUNTER — Other Ambulatory Visit: Payer: Self-pay

## 2019-03-23 ENCOUNTER — Inpatient Hospital Stay (HOSPITAL_BASED_OUTPATIENT_CLINIC_OR_DEPARTMENT_OTHER): Payer: Self-pay | Admitting: Nurse Practitioner

## 2019-03-23 ENCOUNTER — Inpatient Hospital Stay: Payer: Self-pay | Attending: Hematology

## 2019-03-23 ENCOUNTER — Encounter: Payer: Self-pay | Admitting: Nurse Practitioner

## 2019-03-23 ENCOUNTER — Ambulatory Visit: Payer: Self-pay | Admitting: Hematology

## 2019-03-23 ENCOUNTER — Telehealth: Payer: Self-pay | Admitting: Nurse Practitioner

## 2019-03-23 VITALS — BP 130/70 | HR 78 | Temp 98.0°F | Resp 18 | Wt 157.1 lb

## 2019-03-23 DIAGNOSIS — E119 Type 2 diabetes mellitus without complications: Secondary | ICD-10-CM | POA: Insufficient documentation

## 2019-03-23 DIAGNOSIS — D500 Iron deficiency anemia secondary to blood loss (chronic): Secondary | ICD-10-CM | POA: Insufficient documentation

## 2019-03-23 DIAGNOSIS — N920 Excessive and frequent menstruation with regular cycle: Secondary | ICD-10-CM | POA: Insufficient documentation

## 2019-03-23 DIAGNOSIS — E039 Hypothyroidism, unspecified: Secondary | ICD-10-CM | POA: Insufficient documentation

## 2019-03-23 LAB — CBC WITH DIFFERENTIAL (CANCER CENTER ONLY)
Abs Immature Granulocytes: 0.03 10*3/uL (ref 0.00–0.07)
Basophils Absolute: 0 10*3/uL (ref 0.0–0.1)
Basophils Relative: 0 %
Eosinophils Absolute: 0.2 10*3/uL (ref 0.0–0.5)
Eosinophils Relative: 2 %
HCT: 33.2 % — ABNORMAL LOW (ref 36.0–46.0)
Hemoglobin: 10.2 g/dL — ABNORMAL LOW (ref 12.0–15.0)
Immature Granulocytes: 0 %
Lymphocytes Relative: 17 %
Lymphs Abs: 1.6 10*3/uL (ref 0.7–4.0)
MCH: 25.7 pg — ABNORMAL LOW (ref 26.0–34.0)
MCHC: 30.7 g/dL (ref 30.0–36.0)
MCV: 83.6 fL (ref 80.0–100.0)
Monocytes Absolute: 1 10*3/uL (ref 0.1–1.0)
Monocytes Relative: 11 %
Neutro Abs: 6.6 10*3/uL (ref 1.7–7.7)
Neutrophils Relative %: 70 %
Platelet Count: 349 10*3/uL (ref 150–400)
RBC: 3.97 MIL/uL (ref 3.87–5.11)
RDW: 14.8 % (ref 11.5–15.5)
WBC Count: 9.5 10*3/uL (ref 4.0–10.5)
nRBC: 0 % (ref 0.0–0.2)

## 2019-03-23 LAB — IRON AND TIBC
Iron: 15 ug/dL — ABNORMAL LOW (ref 41–142)
Saturation Ratios: 3 % — ABNORMAL LOW (ref 21–57)
TIBC: 449 ug/dL — ABNORMAL HIGH (ref 236–444)
UIBC: 434 ug/dL — ABNORMAL HIGH (ref 120–384)

## 2019-03-23 LAB — FERRITIN: Ferritin: 10 ng/mL — ABNORMAL LOW (ref 11–307)

## 2019-03-23 NOTE — Telephone Encounter (Signed)
Scheduled per 03/11, patient has printed calender.

## 2019-03-23 NOTE — Progress Notes (Signed)
[place]   [phone] [phone]   Clinic Follow up Note   Patient Care Team: Gildardo Pounds, Amy as PCP - General (Nurse Practitioner) 03/23/2019  CHIEF COMPLAINT: F/u iron deficiency anemia   CURRENT THERAPY: Oral iron, IV Feraheme PRN (last given 09/2018)  INTERVAL HISTORY: Amy Maldonado returns for f/u as scheduled. She feels well. She ran out of oral iron has not been taking for 2 months. Denies fatigue. LMP 2/14, remains heavy for 3-4 days then lightens. Had 1 episode of what she attributes to hemorrhoid bleeding with wiping. No oral bleeding. Denies change in bowel habits, weight loss, infection, headache, dizziness, cough, chest pain, or dyspnea. She will have cancer screenings this month through [facility] clinic.    MEDICAL HISTORY:  Past Medical History:  Diagnosis Date  . Anemia   . Diabetes mellitus without complication ([place])   . Hypothyroidism 2011    SURGICAL HISTORY: Past Surgical History:  Procedure Laterality Date  . CESAREAN SECTION     3 c section    I have reviewed the social history and family history with the patient and they are unchanged from previous note.  ALLERGIES:  has No Known Allergies.  MEDICATIONS:  Current Outpatient Medications  Medication Sig Dispense Refill  . atorvastatin (LIPITOR) 20 MG tablet Take 1 tablet (20 mg total) by mouth daily. 90 tablet 3  . Blood Glucose Monitoring Suppl (TRUE METRIX METER) w/Device KIT Use as instructed. 1 kit 0  . ferrous sulfate 325 (65 FE) MG tablet Take 1 tablet (325 mg total) by mouth 3 (three) times daily with meals. 180 tablet 3  . glimepiride (AMARYL) 4 MG tablet Take 1 tablet (4 mg total) by mouth daily before breakfast. 30 tablet 3  . glucose blood (TRUE METRIX BLOOD GLUCOSE TEST) test strip Use as instructed. Monitor blood glucose levels once a day by fingerstick. 100 each 12  . levothyroxine (SYNTHROID) 25 MCG tablet Take 1 tablet (25 mcg total) by mouth  daily before breakfast. 30 tablet 1  . lisinopril (ZESTRIL) 2.5 MG tablet Take 1 tablet (2.5 mg total) by mouth daily. 90 tablet 1  . metFORMIN (GLUCOPHAGE) 500 MG tablet Take 2 tablets (1,000 mg total) by mouth 2 (two) times daily with a meal. 120 tablet 3  . TRUEplus Lancets 28G MISC Use as instructed. Monitor blood glucose levels once a day by fingerstick. 100 each 3   No current facility-administered medications for this visit.    PHYSICAL EXAMINATION:  Vitals:   03/23/19 1420  BP: 130/70  Pulse: 78  Resp: 18  Temp: 98 F (36.7 C)  SpO2: 100%   Filed Weights   03/23/19 1420  Weight: 157 lb 1.6 oz (71.3 kg)    GENERAL:alert, no distress and comfortable SKIN: normal color, no pallor  EYES:  sclera clear LUNGS: clear with normal breathing effort HEART: regular rate & rhythm NEURO: alert & oriented x 3 with fluent speech  LABORATORY DATA:  I have reviewed the data as listed CBC Latest Ref Rng & Units 03/23/2019 12/28/2018 09/23/2018  WBC 4.0 - 10.5 K/uL 9.5 10.4 6.3  Hemoglobin 12.0 - 15.0 g/dL 10.2(L) 12.9 10.0(L)  Hematocrit 36.0 - 46.0 % 33.2(L) 39.3 31.6(L)  Platelets 150 - 400 K/uL 349 301 243    RADIOGRAPHIC STUDIES: I have personally reviewed the radiological images as listed and agreed with the findings in the report. No results found.   ASSESSMENT & PLAN: Amy Maldonado is a 44 y.o.  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   2. Hypothyroidism -recently low, TSH 0.417 her synthroid dose was reduced -Followed by PCP Amy Maldonado    3.Diabetes -HgA1c on 03/09/19 7.2, on metformin and glimepiride  4.Cancer screening -through [place] clinic    Disposition:  Amy Maldonado appears well. Hemoglobin is decreased to 10.2, asymptomatic. She ran out of iron for 2 months and continues to have menorrhagia for 3-4 days each month. Iron studies are pending  from today. I suspect she will need IV Feraheme, she is requesting 3/24. She is back on oral iron TID which she tolerates well. She will return for CBC and iron studies q3 months with IV Feraheme PRN. F/u in 9 months.   No problem-specific Assessment & Plan notes found for this encounter.   No orders of the defined types were placed in this encounter.  All questions were answered. The patient knows to call the clinic with any problems, questions or concerns. No barriers to learning were detected, a Spanish interpreter was present for today's visit and scheduling.     Alla Feeling, Amy 03/23/19

## 2019-03-27 ENCOUNTER — Telehealth: Payer: Self-pay | Admitting: Hematology

## 2019-03-27 NOTE — Telephone Encounter (Signed)
Called patient regarding 03/16 appointment, patient is notified. ?

## 2019-03-28 ENCOUNTER — Other Ambulatory Visit: Payer: Self-pay

## 2019-03-28 ENCOUNTER — Inpatient Hospital Stay: Payer: Self-pay

## 2019-03-28 ENCOUNTER — Ambulatory Visit: Payer: Self-pay

## 2019-03-28 VITALS — BP 115/63 | HR 67 | Temp 98.2°F | Resp 18

## 2019-03-28 DIAGNOSIS — D500 Iron deficiency anemia secondary to blood loss (chronic): Secondary | ICD-10-CM

## 2019-03-28 MED ORDER — SODIUM CHLORIDE 0.9 % IV SOLN
Freq: Once | INTRAVENOUS | Status: AC
  Filled 2019-03-28: qty 250

## 2019-03-28 MED ORDER — SODIUM CHLORIDE 0.9 % IV SOLN
510.0000 mg | Freq: Once | INTRAVENOUS | Status: AC
  Administered 2019-03-28: 510 mg via INTRAVENOUS
  Filled 2019-03-28: qty 510

## 2019-03-28 NOTE — Patient Instructions (Signed)
Ferumoxytol injection What is this medicine? FERUMOXYTOL is an iron complex. Iron is used to make healthy red blood cells, which carry oxygen and nutrients throughout the body. This medicine is used to treat iron deficiency anemia in people with chronic kidney disease. This medicine may be used for other purposes; ask your health care provider or pharmacist if you have questions. COMMON BRAND NAME(S): Feraheme What should I tell my health care provider before I take this medicine? They need to know if you have any of these conditions: -anemia not caused by low iron levels -high levels of iron in the blood -magnetic resonance imaging (MRI) test scheduled -an unusual or allergic reaction to iron, other medicines, foods, dyes, or preservatives -pregnant or trying to get pregnant -breast-feeding How should I use this medicine? This medicine is for injection into a vein. It is given by a health care professional in a hospital or clinic setting. Talk to your pediatrician regarding the use of this medicine in children. Special care may be needed. Overdosage: If you think you have taken too much of this medicine contact a poison control center or emergency room at once. NOTE: This medicine is only for you. Do not share this medicine with others. What if I miss a dose? It is important not to miss your dose. Call your doctor or health care professional if you are unable to keep an appointment. What may interact with this medicine? This medicine may interact with the following medications: -other iron products This list may not describe all possible interactions. Give your health care provider a list of all the medicines, herbs, non-prescription drugs, or dietary supplements you use. Also tell them if you smoke, drink alcohol, or use illegal drugs. Some items may interact with your medicine. What should I watch for while using this medicine? Visit your doctor or healthcare professional regularly. Tell  your doctor or healthcare professional if your symptoms do not start to get better or if they get worse. You may need blood work done while you are taking this medicine. You may need to follow a special diet. Talk to your doctor. Foods that contain iron include: whole grains/cereals, dried fruits, beans, or peas, leafy green vegetables, and organ meats (liver, kidney). What side effects may I notice from receiving this medicine? Side effects that you should report to your doctor or health care professional as soon as possible: -allergic reactions like skin rash, itching or hives, swelling of the face, lips, or tongue -breathing problems -changes in blood pressure -feeling faint or lightheaded, falls -fever or chills -flushing, sweating, or hot feelings -swelling of the ankles or feet Side effects that usually do not require medical attention (report to your doctor or health care professional if they continue or are bothersome): -diarrhea -headache -nausea, vomiting -stomach pain This list may not describe all possible side effects. Call your doctor for medical advice about side effects. You may report side effects to FDA at 1-800-FDA-1088. Where should I keep my medicine? This drug is given in a hospital or clinic and will not be stored at home. NOTE: This sheet is a summary. It may not cover all possible information. If you have questions about this medicine, talk to your doctor, pharmacist, or health care provider.  2018 Elsevier/Gold Standard (2015-01-31 12:41:49) Influenza Virus Vaccine (Flucelvax) What is this medicine? INFLUENZA VIRUS VACCINE (in floo EN zuh VAHY ruhs vak SEEN) helps to reduce the risk of getting influenza also known as the flu. The vaccine only helps  protect you against some strains of the flu. This medicine may be used for other purposes; ask your health care provider or pharmacist if you have questions. COMMON BRAND NAME(S): FLUCELVAX What should I tell my health  care provider before I take this medicine? They need to know if you have any of these conditions:  bleeding disorder like hemophilia  fever or infection  Guillain-Barre syndrome or other neurological problems  immune system problems  infection with the human immunodeficiency virus (HIV) or AIDS  low blood platelet counts  multiple sclerosis  an unusual or allergic reaction to influenza virus vaccine, other medicines, foods, dyes or preservatives  pregnant or trying to get pregnant  breast-feeding How should I use this medicine? This vaccine is for injection into a muscle. It is given by a health care professional. A copy of Vaccine Information Statements will be given before each vaccination. Read this sheet carefully each time. The sheet may change frequently. Talk to your pediatrician regarding the use of this medicine in children. Special care may be needed. Overdosage: If you think you've taken too much of this medicine contact a poison control center or emergency room at once. Overdosage: If you think you have taken too much of this medicine contact a poison control center or emergency room at once. NOTE: This medicine is only for you. Do not share this medicine with others. What if I miss a dose? This does not apply. What may interact with this medicine?  chemotherapy or radiation therapy  medicines that lower your immune system like etanercept, anakinra, infliximab, and adalimumab  medicines that treat or prevent blood clots like warfarin  phenytoin  steroid medicines like prednisone or cortisone  theophylline  vaccines This list may not describe all possible interactions. Give your health care provider a list of all the medicines, herbs, non-prescription drugs, or dietary supplements you use. Also tell them if you smoke, drink alcohol, or use illegal drugs. Some items may interact with your medicine. What should I watch for while using this medicine? Report any  side effects that do not go away within 3 days to your doctor or health care professional. Call your health care provider if any unusual symptoms occur within 6 weeks of receiving this vaccine. You may still catch the flu, but the illness is not usually as bad. You cannot get the flu from the vaccine. The vaccine will not protect against colds or other illnesses that may cause fever. The vaccine is needed every year. What side effects may I notice from receiving this medicine? Side effects that you should report to your doctor or health care professional as soon as possible:  allergic reactions like skin rash, itching or hives, swelling of the face, lips, or tongue Side effects that usually do not require medical attention (Report these to your doctor or health care professional if they continue or are bothersome.):  fever  headache  muscle aches and pains  pain, tenderness, redness, or swelling at the injection site  tiredness This list may not describe all possible side effects. Call your doctor for medical advice about side effects. You may report side effects to FDA at 1-800-FDA-1088. Where should I keep my medicine? The vaccine will be given by a health care professional in a clinic, pharmacy, doctor's office, or other health care setting. You will not be given vaccine doses to store at home. NOTE: This sheet is a summary. It may not cover all possible information. If you have questions about  this medicine, talk to your doctor, pharmacist, or health care provider.  2020 Elsevier/Gold Standard (2010-12-10 14:06:47)

## 2019-04-05 ENCOUNTER — Other Ambulatory Visit: Payer: Self-pay

## 2019-04-05 ENCOUNTER — Inpatient Hospital Stay: Payer: Self-pay

## 2019-04-05 VITALS — BP 98/60 | HR 66 | Temp 98.0°F | Resp 16

## 2019-04-05 DIAGNOSIS — D500 Iron deficiency anemia secondary to blood loss (chronic): Secondary | ICD-10-CM

## 2019-04-05 MED ORDER — SODIUM CHLORIDE 0.9 % IV SOLN
510.0000 mg | Freq: Once | INTRAVENOUS | Status: AC
  Administered 2019-04-05: 510 mg via INTRAVENOUS
  Filled 2019-04-05: qty 510

## 2019-04-05 MED ORDER — SODIUM CHLORIDE 0.9 % IV SOLN
Freq: Once | INTRAVENOUS | Status: AC
  Filled 2019-04-05: qty 250

## 2019-04-05 NOTE — Patient Instructions (Signed)
Ferumoxytol injection What is this medicine? FERUMOXYTOL is an iron complex. Iron is used to make healthy red blood cells, which carry oxygen and nutrients throughout the body. This medicine is used to treat iron deficiency anemia. This medicine may be used for other purposes; ask your health care provider or pharmacist if you have questions. COMMON BRAND NAME(S): Feraheme What should I tell my health care provider before I take this medicine? They need to know if you have any of these conditions:  anemia not caused by low iron levels  high levels of iron in the blood  magnetic resonance imaging (MRI) test scheduled  an unusual or allergic reaction to iron, other medicines, foods, dyes, or preservatives  pregnant or trying to get pregnant  breast-feeding How should I use this medicine? This medicine is for injection into a vein. It is given by a health care professional in a hospital or clinic setting. Talk to your pediatrician regarding the use of this medicine in children. Special care may be needed. Overdosage: If you think you have taken too much of this medicine contact a poison control center or emergency room at once. NOTE: This medicine is only for you. Do not share this medicine with others. What if I miss a dose? It is important not to miss your dose. Call your doctor or health care professional if you are unable to keep an appointment. What may interact with this medicine? This medicine may interact with the following medications:  other iron products This list may not describe all possible interactions. Give your health care provider a list of all the medicines, herbs, non-prescription drugs, or dietary supplements you use. Also tell them if you smoke, drink alcohol, or use illegal drugs. Some items may interact with your medicine. What should I watch for while using this medicine? Visit your doctor or healthcare professional regularly. Tell your doctor or healthcare  professional if your symptoms do not start to get better or if they get worse. You may need blood work done while you are taking this medicine. You may need to follow a special diet. Talk to your doctor. Foods that contain iron include: whole grains/cereals, dried fruits, beans, or peas, leafy green vegetables, and organ meats (liver, kidney). What side effects may I notice from receiving this medicine? Side effects that you should report to your doctor or health care professional as soon as possible:  allergic reactions like skin rash, itching or hives, swelling of the face, lips, or tongue  breathing problems  changes in blood pressure  feeling faint or lightheaded, falls  fever or chills  flushing, sweating, or hot feelings  swelling of the ankles or feet Side effects that usually do not require medical attention (report to your doctor or health care professional if they continue or are bothersome):  diarrhea  headache  nausea, vomiting  stomach pain This list may not describe all possible side effects. Call your doctor for medical advice about side effects. You may report side effects to FDA at 1-800-FDA-1088. Where should I keep my medicine? This drug is given in a hospital or clinic and will not be stored at home. NOTE: This sheet is a summary. It may not cover all possible information. If you have questions about this medicine, talk to your doctor, pharmacist, or health care provider.  2020 Elsevier/Gold Standard (2016-02-17 20:21:10) Coronavirus (COVID-19) Are you at risk?  Are you at risk for the Coronavirus (COVID-19)?  To be considered HIGH RISK for Coronavirus (COVID-19),   you have to meet the following criteria:  . Traveled to [place], [place], [place], [place] or [place]; or in the [place] to [place], [place], [place], or [place]; and have fever, cough, and shortness of breath within the last 2 weeks of travel OR . Been in close contact with a person  diagnosed with COVID-19 within the last 2 weeks and have fever, cough, and shortness of breath . IF YOU DO NOT MEET THESE CRITERIA, YOU ARE CONSIDERED LOW RISK FOR COVID-19.  What to do if you are HIGH RISK for COVID-19?  . If you are having a medical emergency, call 911. . Seek medical care right away. Before you go to a doctor's office, urgent care or emergency department, call ahead and tell them about your recent travel, contact with someone diagnosed with COVID-19, and your symptoms. You should receive instructions from your physician's office regarding next steps of care.  . When you arrive at healthcare provider, tell the healthcare staff immediately you have returned from visiting [place], [place], [place], [place] or [place]; or traveled in the [place] to [place], [place], [place], or [place]; in the last two weeks or you have been in close contact with a person diagnosed with COVID-19 in the last 2 weeks.   . Tell the health care staff about your symptoms: fever, cough and shortness of breath. . After you have been seen by a medical provider, you will be either: o Tested for (COVID-19) and discharged home on quarantine except to seek medical care if symptoms worsen, and asked to  - Stay home and avoid contact with others until you get your results (4-5 days)  - Avoid travel on public transportation if possible (such as bus, train, or airplane) or o Sent to the Emergency Department by EMS for evaluation, COVID-19 testing, and possible admission depending on your condition and test results.  What to do if you are LOW RISK for COVID-19?  Reduce your risk of any infection by using the same precautions used for avoiding the common cold or flu:  . Wash your hands often with soap and warm water for at least 20 seconds.  If soap and water are not readily available, use an alcohol-based hand sanitizer with at least 60% alcohol.  . If coughing or sneezing, cover your mouth and nose by  coughing or sneezing into the elbow areas of your shirt or coat, into a tissue or into your sleeve (not your hands). . Avoid shaking hands with others and consider head nods or verbal greetings only. . Avoid touching your eyes, nose, or mouth with unwashed hands.  . Avoid close contact with people who are sick. . Avoid places or events with large numbers of people in one location, like concerts or sporting events. . Carefully consider travel plans you have or are making. . If you are planning any travel outside or inside the [place], visit the CDC's Travelers' Health webpage for the latest health notices. . If you have some symptoms but not all symptoms, continue to monitor at home and seek medical attention if your symptoms worsen. . If you are having a medical emergency, call 911.   ADDITIONAL HEALTHCARE OPTIONS FOR PATIENTS  [facility] / e-Visit: [email or web address]         [facility]: [phone]  [facility]: [phone]                   [facility]: [phone]   

## 2019-04-11 ENCOUNTER — Telehealth: Payer: Self-pay | Admitting: *Deleted

## 2019-04-11 NOTE — Telephone Encounter (Signed)
Informed pt not to come tomorrow for infusion.  Pt expressed understanding.

## 2019-04-12 ENCOUNTER — Inpatient Hospital Stay: Payer: Self-pay

## 2019-04-19 ENCOUNTER — Ambulatory Visit: Payer: Self-pay | Attending: Nurse Practitioner

## 2019-04-19 ENCOUNTER — Other Ambulatory Visit: Payer: Self-pay

## 2019-04-19 DIAGNOSIS — E039 Hypothyroidism, unspecified: Secondary | ICD-10-CM

## 2019-04-19 MED FILL — LISINOPRIL 2.5 MG TABLET: 2.5 | 30 days supply | Qty: 30 | Fill #1

## 2019-04-20 LAB — TSH: TSH: 4.32 u[IU]/mL (ref 0.450–4.500)

## 2019-04-24 ENCOUNTER — Other Ambulatory Visit: Payer: Self-pay | Admitting: Nurse Practitioner

## 2019-04-24 DIAGNOSIS — E039 Hypothyroidism, unspecified: Secondary | ICD-10-CM

## 2019-04-24 MED ORDER — LEVOTHYROXINE SODIUM 25 MCG PO TABS: 25.0000 ug | ORAL_TABLET | Freq: Every day | ORAL | 0 refills | Status: DC

## 2019-04-25 MED FILL — ?LEVOTHYROXINE 25 MCG TABLE: 25 | 30 days supply | Qty: 30 | Fill #0

## 2019-05-08 ENCOUNTER — Other Ambulatory Visit: Payer: Self-pay | Admitting: Nurse Practitioner

## 2019-05-08 DIAGNOSIS — E039 Hypothyroidism, unspecified: Secondary | ICD-10-CM

## 2019-06-06 ENCOUNTER — Other Ambulatory Visit: Payer: Self-pay

## 2019-06-06 ENCOUNTER — Encounter: Payer: Self-pay | Admitting: Nurse Practitioner

## 2019-06-06 ENCOUNTER — Ambulatory Visit: Payer: Self-pay | Attending: Nurse Practitioner | Admitting: Nurse Practitioner

## 2019-06-06 DIAGNOSIS — E119 Type 2 diabetes mellitus without complications: Secondary | ICD-10-CM

## 2019-06-06 DIAGNOSIS — E039 Hypothyroidism, unspecified: Secondary | ICD-10-CM

## 2019-06-06 DIAGNOSIS — D509 Iron deficiency anemia, unspecified: Secondary | ICD-10-CM

## 2019-06-06 DIAGNOSIS — E785 Hyperlipidemia, unspecified: Secondary | ICD-10-CM

## 2019-06-06 MED ORDER — LEVOTHYROXINE SODIUM 25 MCG PO TABS: ORAL_TABLET | ORAL | 2 refills | Status: DC

## 2019-06-06 MED ORDER — TRUEPLUS LANCETS 28G MISC: 3 refills | Status: DC

## 2019-06-06 MED ORDER — LISINOPRIL 2.5 MG PO TABS: 2.5000 mg | ORAL_TABLET | Freq: Every day | ORAL | 1 refills | Status: DC

## 2019-06-06 MED ORDER — ATORVASTATIN CALCIUM 20 MG PO TABS: 20.0000 mg | ORAL_TABLET | Freq: Every day | ORAL | 3 refills | Status: DC

## 2019-06-06 MED ORDER — GLIMEPIRIDE 4 MG PO TABS: 4.0000 mg | ORAL_TABLET | Freq: Every day | ORAL | 3 refills | Status: DC

## 2019-06-06 MED ORDER — TRUE METRIX BLOOD GLUCOSE TEST VI STRP: ORAL_STRIP | 12 refills | Status: DC

## 2019-06-06 MED ORDER — METFORMIN HCL 500 MG PO TABS: 1000.0000 mg | ORAL_TABLET | Freq: Two times a day (BID) | ORAL | 3 refills | Status: DC

## 2019-06-06 MED ORDER — FERROUS SULFATE 325 (65 FE) MG PO TABS: 325.0000 mg | ORAL_TABLET | Freq: Three times a day (TID) | ORAL | 3 refills | Status: DC

## 2019-06-06 MED FILL — METFORMIN HCL 500 MG TABS: 500 | 30 days supply | Qty: 120 | Fill #0

## 2019-06-06 MED FILL — GLIMEPIRIDE 4 MG TABS: 4 | 30 days supply | Qty: 30 | Fill #0

## 2019-06-06 MED FILL — TRUEplus LANCETS 28G MISC: 100 days supply | Qty: 100 | Fill #0

## 2019-06-06 MED FILL — FERROUS SULFATE 325 MG TAB: 325 (65 FE) | 30 days supply | Qty: 90 | Fill #0

## 2019-06-06 MED FILL — TRUE METRIX TEST STRIP: 100 days supply | Qty: 100 | Fill #0

## 2019-06-06 MED FILL — LISINOPRIL 2.5 MG TABLET: 2.5 | 30 days supply | Qty: 30 | Fill #0

## 2019-06-06 MED FILL — ?ATORVASTATIN 20 MG TABLET: 20 | 30 days supply | Qty: 30 | Fill #0

## 2019-06-06 MED FILL — ?LEVOTHYROXINE SODIUM 25 MC: 25 | 30 days supply | Qty: 30 | Fill #0

## 2019-06-06 NOTE — Progress Notes (Signed)
Virtual Visit via Telephone Note Due to national recommendations of social distancing due to [place] 19, telehealth visit is felt to be most appropriate for this patient at this time.  I discussed the limitations, risks, security and privacy concerns of performing an evaluation and management service by telephone and the availability of in person appointments. I also discussed with the patient that there may be a patient responsible charge related to this service. The patient expressed understanding and agreed to proceed.    I connected with Sawda Badalamenti on 06/06/19  at   3:10 PM EDT  EDT by telephone and verified that I am speaking with the correct person using two identifiers.   Consent I discussed the limitations, risks, security and privacy concerns of performing an evaluation and management service by telephone and the availability of in person appointments. I also discussed with the patient that there may be a patient responsible charge related to this service. The patient expressed understanding and agreed to proceed.   Location of Patient: Private Residence   Location of Provider: [facility]    Persons participating in Telemedicine visit: Geryl Rankins FNP-BC [place] INTERPRETER: [place]   History of Present Illness: Telemedicine visit for: Follow up  DM TYPE 2 Monitoring her blood glucose levels twice per day. Last reading postprandial yesterday 170. Average readings 110s-190. Currently taking metformin 1000 mg BID. Taking renal dose ACE and STATIN as prescribed. LDL at goal. She has not been taking glimepiride 4 mg daily as she did not receive it from the pharmacy. She is currently taking metformin 1000 mg BID only. Denies any symptoms of hypo or hyperglycemia.  Lab Results  Component Value Date   HGBA1C 7.2 (A) 03/09/2019   Lab Results  Component Value Date   LDLCALC 56 03/09/2019     Hypothyroidism Well controlled. Taking synthroid 25 mg daily as prescribed. Denies fatigue, sudden weight loss or weight gain, mood lability.  Lab Results  Component Value Date   TSH 4.320 04/19/2019    Past Medical History:  Diagnosis Date  . Anemia   . Diabetes mellitus without complication ([place])   . Hypothyroidism 2011    Past Surgical History:  Procedure Laterality Date  . CESAREAN SECTION     3 c section    Family History  Problem Relation Age of Onset  . Hypertension Mother   . Diabetes Father   . Hypertension Father   . Cancer Father        prostate cancer    Social History   Socioeconomic History  . Marital status: Married    Spouse name: Not on file  . Number of children: Not on file  . Years of education: Not on file  . Highest education level: Not on file  Occupational History  . Not on file  Tobacco Use  . Smoking status: Never Smoker  . Smokeless tobacco: Never Used  Substance and Sexual Activity  . Alcohol use: No  . Drug use: No  . Sexual activity: Yes  Other Topics Concern  . Not on file  Social History Narrative  . Not on file   Social Determinants of Health   Financial Resource Strain:   . Difficulty of Paying Living Expenses:   Food Insecurity:   . Worried About Charity fundraiser in the Last Year:   . Arboriculturist in the Last Year:   Transportation Needs:   .  Lack of Transportation (Medical):   Marland Kitchen Lack of Transportation (Non-Medical):   Physical Activity:   . Days of Exercise per Week:   . Minutes of Exercise per Session:   Stress:   . Feeling of Stress :   Social Connections:   . Frequency of Communication with Friends and Family:   . Frequency of Social Gatherings with Friends and Family:   . Attends Religious Services:   . Active Member of Clubs or Organizations:   . Attends Archivist Meetings:   Marland Kitchen Marital Status:      Observations/Objective: Awake, alert and oriented x 3   Review of Systems   Constitutional: Negative for fever, malaise/fatigue and weight loss.  HENT: Negative.  Negative for nosebleeds.   Eyes: Negative.  Negative for blurred vision, double vision and photophobia.  Respiratory: Negative.  Negative for cough and shortness of breath.   Cardiovascular: Negative.  Negative for chest pain, palpitations and leg swelling.  Gastrointestinal: Negative.  Negative for heartburn, nausea and vomiting.  Musculoskeletal: Negative.  Negative for myalgias.  Neurological: Negative.  Negative for dizziness, focal weakness, seizures and headaches.  Psychiatric/Behavioral: Negative.  Negative for suicidal ideas.    Assessment and Plan: Alleyne Rudge was seen today for follow-up.  Diagnoses and all orders for this visit:  Controlled type 2 diabetes mellitus without complication, without long-term current use of insulin (HCC) -     glimepiride (AMARYL) 4 MG tablet; Take 1 tablet (4 mg total) by mouth daily before breakfast. -     glucose blood (TRUE METRIX BLOOD GLUCOSE TEST) test strip; Use as instructed. Monitor blood glucose levels once a day by fingerstick. -     lisinopril (ZESTRIL) 2.5 MG tablet; Take 1 tablet (2.5 mg total) by mouth daily. -     metFORMIN (GLUCOPHAGE) 500 MG tablet; Take 2 tablets (1,000 mg total) by mouth 2 (two) times daily with a meal. -     TRUEplus Lancets 28G MISC; Use as instructed. Monitor blood glucose levels once a day by fingerstick. Continue blood sugar control as discussed in office today, low carbohydrate diet, and regular physical exercise as tolerated, 150 minutes per week (30 min each day, 5 days per week, or 50 min 3 days per week). Keep blood sugar logs with fasting goal of 90-130 mg/dl, post prandial (after you eat) less than 180.  For Hypoglycemia: BS <60 and Hyperglycemia BS >400; contact the clinic ASAP. Annual eye exams and foot exams are recommended.  Hyperlipidemia LDL goal <70 -     atorvastatin (LIPITOR) 20 MG tablet; Take 1 tablet (20  mg total) by mouth daily. INSTRUCTIONS: Work on a low fat, heart healthy diet and participate in regular aerobic exercise program by working out at least 150 minutes per week; 5 days a week-30 minutes per day. Avoid red meat/beef/steak,  fried foods. junk foods, sodas, sugary drinks, unhealthy snacking, alcohol and smoking.  Drink at least 80 oz of water per day and monitor your carbohydrate intake daily.    Hypothyroidism, unspecified type -     levothyroxine (SYNTHROID) 25 MCG tablet; TAKE 1 TABLET(25 MCG) BY MOUTH DAILY BEFORE BREAKFAST  Iron deficiency anemia, unspecified iron deficiency anemia type -     ferrous sulfate 325 (65 FE) MG tablet; Take 1 tablet (325 mg total) by mouth 3 (three) times daily with meals.     Follow Up Instructions Return in about 3 months (around 09/06/2019).     I discussed the assessment and treatment plan  with the patient. The patient was provided an opportunity to ask questions and all were answered. The patient agreed with the plan and demonstrated an understanding of the instructions.   The patient was advised to call back or seek an in-person evaluation if the symptoms worsen or if the condition fails to improve as anticipated.  I provided 18 minutes of non-face-to-face time during this encounter including median intraservice time, reviewing previous notes, labs, imaging, medications and explaining diagnosis and management.  Gildardo Pounds, FNP-BC

## 2019-06-14 ENCOUNTER — Ambulatory Visit: Payer: Self-pay | Attending: Nurse Practitioner

## 2019-06-14 ENCOUNTER — Other Ambulatory Visit: Payer: Self-pay

## 2019-06-14 DIAGNOSIS — E119 Type 2 diabetes mellitus without complications: Secondary | ICD-10-CM

## 2019-06-15 LAB — BASIC METABOLIC PANEL
BUN/Creatinine Ratio: 15 (ref 9–23)
BUN: 8 mg/dL (ref 6–24)
CO2: 22 mmol/L (ref 20–29)
Calcium: 9.4 mg/dL (ref 8.7–10.2)
Chloride: 104 mmol/L (ref 96–106)
Creatinine, Ser: 0.53 mg/dL — ABNORMAL LOW (ref 0.57–1.00)
GFR calc Af Amer: 134 mL/min/{1.73_m2} (ref >59)
GFR calc non Af Amer: 117 mL/min/{1.73_m2} (ref >59)
Glucose: 114 mg/dL — ABNORMAL HIGH (ref 65–99)
Potassium: 4.2 mmol/L (ref 3.5–5.2)
Sodium: 142 mmol/L (ref 134–144)

## 2019-06-15 LAB — HEMOGLOBIN A1C
Est. average glucose Bld gHb Est-mCnc: 197 mg/dL
Hgb A1c MFr Bld: 8.5 % — ABNORMAL HIGH (ref 4.8–5.6)

## 2019-06-15 LAB — LIPID PANEL
Chol/HDL Ratio: 3.2 ratio (ref 0.0–4.4)
Cholesterol, Total: 161 mg/dL (ref 100–199)
HDL: 50 mg/dL (ref >39)
LDL Chol Calc (NIH): 84 mg/dL (ref 0–99)
Triglycerides: 154 mg/dL — ABNORMAL HIGH (ref 0–149)
VLDL Cholesterol Cal: 27 mg/dL (ref 5–40)

## 2019-06-15 LAB — CBC
Hematocrit: 33.5 % — ABNORMAL LOW (ref 34.0–46.6)
Hemoglobin: 10.8 g/dL — ABNORMAL LOW (ref 11.1–15.9)
MCH: 30.2 pg (ref 26.6–33.0)
MCHC: 32.2 g/dL (ref 31.5–35.7)
MCV: 94 fL (ref 79–97)
Platelets: 276 10*3/uL (ref 150–450)
RBC: 3.58 x10E6/uL — ABNORMAL LOW (ref 3.77–5.28)
RDW: 16.2 % — ABNORMAL HIGH (ref 11.7–15.4)
WBC: 9.3 10*3/uL (ref 3.4–10.8)

## 2019-06-17 ENCOUNTER — Other Ambulatory Visit: Payer: Self-pay | Admitting: Nurse Practitioner

## 2019-06-17 MED ORDER — SITAGLIPTIN PHOSPHATE 25 MG PO TABS: 25.0000 mg | ORAL_TABLET | Freq: Every day | ORAL | 3 refills | Status: DC

## 2019-06-23 ENCOUNTER — Inpatient Hospital Stay: Payer: Self-pay

## 2019-06-28 ENCOUNTER — Other Ambulatory Visit: Payer: Self-pay

## 2019-06-28 ENCOUNTER — Inpatient Hospital Stay: Payer: Self-pay | Attending: Hematology

## 2019-06-28 DIAGNOSIS — D500 Iron deficiency anemia secondary to blood loss (chronic): Secondary | ICD-10-CM | POA: Insufficient documentation

## 2019-06-28 DIAGNOSIS — N920 Excessive and frequent menstruation with regular cycle: Secondary | ICD-10-CM | POA: Insufficient documentation

## 2019-06-28 LAB — CBC WITH DIFFERENTIAL (CANCER CENTER ONLY)
Abs Immature Granulocytes: 0.02 10*3/uL (ref 0.00–0.07)
Basophils Absolute: 0 10*3/uL (ref 0.0–0.1)
Basophils Relative: 0 %
Eosinophils Absolute: 0.1 10*3/uL (ref 0.0–0.5)
Eosinophils Relative: 1 %
HCT: 34.5 % — ABNORMAL LOW (ref 36.0–46.0)
Hemoglobin: 11.2 g/dL — ABNORMAL LOW (ref 12.0–15.0)
Immature Granulocytes: 0 %
Lymphocytes Relative: 24 %
Lymphs Abs: 2.1 10*3/uL (ref 0.7–4.0)
MCH: 29.2 pg (ref 26.0–34.0)
MCHC: 32.5 g/dL (ref 30.0–36.0)
MCV: 90.1 fL (ref 80.0–100.0)
Monocytes Absolute: 0.8 10*3/uL (ref 0.1–1.0)
Monocytes Relative: 9 %
Neutro Abs: 5.5 10*3/uL (ref 1.7–7.7)
Neutrophils Relative %: 66 %
Platelet Count: 321 10*3/uL (ref 150–400)
RBC: 3.83 MIL/uL — ABNORMAL LOW (ref 3.87–5.11)
RDW: 14.7 % (ref 11.5–15.5)
WBC Count: 8.5 10*3/uL (ref 4.0–10.5)
nRBC: 0 % (ref 0.0–0.2)

## 2019-06-29 LAB — IRON AND TIBC
Iron: 82 ug/dL (ref 41–142)
Saturation Ratios: 21 % (ref 21–57)
TIBC: 383 ug/dL (ref 236–444)
UIBC: 301 ug/dL (ref 120–384)

## 2019-06-29 LAB — FERRITIN: Ferritin: 49 ng/mL (ref 11–307)

## 2019-08-02 ENCOUNTER — Other Ambulatory Visit: Payer: Self-pay

## 2019-08-02 ENCOUNTER — Ambulatory Visit: Payer: Self-pay | Attending: Nurse Practitioner

## 2019-08-02 MED FILL — METFORMIN HCL 500 MG TABS: 500 | 30 days supply | Qty: 120 | Fill #1

## 2019-08-02 MED FILL — JANUVIA 25 MG TABLET: 25 | 30 days supply | Qty: 30 | Fill #1

## 2019-08-02 MED FILL — GLIMEPIRIDE 4 MG TABS: 4 | 30 days supply | Qty: 30 | Fill #1

## 2019-08-02 MED FILL — LISINOPRIL 2.5 MG TABLET: 2.5 | 30 days supply | Qty: 30 | Fill #1

## 2019-08-13 ENCOUNTER — Other Ambulatory Visit: Payer: Self-pay | Admitting: Family Medicine

## 2019-08-13 DIAGNOSIS — E039 Hypothyroidism, unspecified: Secondary | ICD-10-CM

## 2019-09-06 ENCOUNTER — Ambulatory Visit: Payer: Self-pay | Admitting: Nurse Practitioner

## 2019-09-14 ENCOUNTER — Encounter: Payer: Self-pay | Admitting: Pharmacy Technician

## 2019-09-14 NOTE — Progress Notes (Signed)
Patient has been approved for drug assistance by [employer] for Feraheme. The enrollment period is from 04/13/19-04/09/20 based on self pay. First DOS covered is 03/28/19.

## 2019-09-22 ENCOUNTER — Inpatient Hospital Stay: Payer: No Typology Code available for payment source | Attending: Hematology

## 2019-09-22 DIAGNOSIS — N920 Excessive and frequent menstruation with regular cycle: Secondary | ICD-10-CM | POA: Insufficient documentation

## 2019-09-22 DIAGNOSIS — D500 Iron deficiency anemia secondary to blood loss (chronic): Secondary | ICD-10-CM | POA: Insufficient documentation

## 2019-09-26 ENCOUNTER — Ambulatory Visit: Payer: Self-pay | Admitting: *Deleted

## 2019-09-26 ENCOUNTER — Other Ambulatory Visit: Payer: Self-pay

## 2019-09-26 VITALS — BP 120/74 | Temp 97.7°F | Wt 157.4 lb

## 2019-09-26 DIAGNOSIS — Z1239 Encounter for other screening for malignant neoplasm of breast: Secondary | ICD-10-CM

## 2019-09-26 NOTE — Progress Notes (Signed)
Amy Maldonado is a 44 y.o. female who presents to [facility] clinic today with no complaints.    Pap Smear: Pap smear not completed today. Last Pap smear was 02/15/2017 at [facility] and was normal with negative HPV. Per patient has no history of an abnormal Pap smear. Last Pap smear result is available in Epic.   Physical exam: Breasts Breasts symmetrical. No skin abnormalities bilateral breasts. No nipple retraction bilateral breasts. No nipple discharge bilateral breasts. No lymphadenopathy. No lumps palpated bilateral breasts. No complaints of pain or tenderness on exam.      Pelvic/Bimanual Pap is not indicated today per BCCCP guidelines.   Smoking History: Patient has never smoked.   Patient Navigation: Patient education provided. Access to services provided for patient through [facility] program. Spanish interpreter Rudene Anda from [facility] provided.   Breast and Cervical Cancer Risk Assessment: Patient does not have family history of breast cancer, known genetic mutations, or radiation treatment to the chest before age 54. Patient does not have history of cervical dysplasia, immunocompromised, or DES exposure in-utero.  Risk Assessment    Risk Scores      09/26/2019 12/07/2017   Last edited by: Demetrius Revel, LPN Rolena Infante H, LPN   5-year risk: 0.4 % 0.4 %   Lifetime risk: 6.2 % 6.3 %          A: BCCCP exam without pap smear No complaints.  P: Referred patient to the [facility] for a screening mammogram. Appointment scheduled Tuesday, October 03, 2019 at 0830.  Loletta Parish, RN 09/26/2019 8:49 AM

## 2019-09-26 NOTE — Patient Instructions (Signed)
Explained breast self awareness with [name]. Patient did not need a Pap smear today due to last Pap smear and HPV typing was 02/15/2017. Let her know [facility] will cover Pap smears and HPV typing every 5 years unless has a history of abnormal Pap smears. Referred patient to the [facility] for a screening mammogram. Appointment scheduled Tuesday, October 03, 2019 at 0830. Patient aware of appointment and will be there. Let patient know the [facility] will follow up with her within couple weeks following appointment with results of her mammogram by letter or phone. [place] verbalized understanding.  Saloni Lablanc, Arvil Chaco, RN 8:50 AM    [place]

## 2019-10-02 MED FILL — METFORMIN HCL 500 MG TABS: 500 | 30 days supply | Qty: 120 | Fill #2

## 2019-10-02 MED FILL — LISINOPRIL 2.5 MG TABLET: 2.5 | 30 days supply | Qty: 30 | Fill #2

## 2019-10-02 MED FILL — GLIMEPIRIDE 4 MG TABS: 4 | 30 days supply | Qty: 30 | Fill #2

## 2019-10-02 MED FILL — JANUVIA 25 MG TABLET: 25 | 30 days supply | Qty: 30 | Fill #2

## 2019-10-02 MED FILL — FERROUS SULFATE 325 MG TAB: 325 (65 FE) | 30 days supply | Qty: 90 | Fill #1

## 2019-10-02 MED FILL — ATORVASTATIN CALCIUM 20 MG: 20 | 30 days supply | Qty: 30 | Fill #1

## 2019-10-03 ENCOUNTER — Ambulatory Visit
Admission: RE | Admit: 2019-10-03 | Discharge: 2019-10-03 | Disposition: A | Discharge status: Home / Self Care | Payer: No Typology Code available for payment source | Source: Ambulatory Visit | Attending: Obstetrics and Gynecology | Admitting: Obstetrics and Gynecology

## 2019-10-03 ENCOUNTER — Other Ambulatory Visit: Payer: Self-pay

## 2019-10-03 DIAGNOSIS — Z1231 Encounter for screening mammogram for malignant neoplasm of breast: Secondary | ICD-10-CM

## 2019-10-11 ENCOUNTER — Other Ambulatory Visit: Payer: Self-pay

## 2019-10-11 ENCOUNTER — Inpatient Hospital Stay: Payer: Self-pay

## 2019-10-11 DIAGNOSIS — D500 Iron deficiency anemia secondary to blood loss (chronic): Secondary | ICD-10-CM

## 2019-10-11 LAB — IRON AND TIBC
Iron: 24 ug/dL — ABNORMAL LOW (ref 41–142)
Saturation Ratios: 6 % — ABNORMAL LOW (ref 21–57)
TIBC: 390 ug/dL (ref 236–444)
UIBC: 366 ug/dL (ref 120–384)

## 2019-10-11 LAB — CBC WITH DIFFERENTIAL (CANCER CENTER ONLY)
Abs Immature Granulocytes: 0.05 10*3/uL (ref 0.00–0.07)
Basophils Absolute: 0 10*3/uL (ref 0.0–0.1)
Basophils Relative: 0 %
Eosinophils Absolute: 0.1 10*3/uL (ref 0.0–0.5)
Eosinophils Relative: 2 %
HCT: 36.5 % (ref 36.0–46.0)
Hemoglobin: 11.7 g/dL — ABNORMAL LOW (ref 12.0–15.0)
Immature Granulocytes: 1 %
Lymphocytes Relative: 20 %
Lymphs Abs: 1.8 10*3/uL (ref 0.7–4.0)
MCH: 27.7 pg (ref 26.0–34.0)
MCHC: 32.1 g/dL (ref 30.0–36.0)
MCV: 86.5 fL (ref 80.0–100.0)
Monocytes Absolute: 0.7 10*3/uL (ref 0.1–1.0)
Monocytes Relative: 7 %
Neutro Abs: 6.3 10*3/uL (ref 1.7–7.7)
Neutrophils Relative %: 70 %
Platelet Count: 322 10*3/uL (ref 150–400)
RBC: 4.22 MIL/uL (ref 3.87–5.11)
RDW: 18.7 % — ABNORMAL HIGH (ref 11.5–15.5)
WBC Count: 9 10*3/uL (ref 4.0–10.5)
nRBC: 0 % (ref 0.0–0.2)

## 2019-10-11 LAB — FERRITIN: Ferritin: 50 ng/mL (ref 11–307)

## 2019-10-12 ENCOUNTER — Telehealth: Payer: Self-pay

## 2019-10-12 NOTE — Telephone Encounter (Signed)
-----   Message from Truitt Merle, MD sent at 10/12/2019  8:03 AM EDT ----- Please let pt know her lab results, iron level slightly low, make sure she is taking oral iron 2-3 times a day, no need iv iron now. Thanks   Truitt Merle

## 2019-10-12 NOTE — Telephone Encounter (Signed)
This nurse use interpretor line operator Tammi Klippel # [phone] makes pt aware of lab results asks her to make sure she is taking iron supplements and to call for any questions concerns or changes

## 2019-10-18 ENCOUNTER — Ambulatory Visit: Payer: No Typology Code available for payment source

## 2019-10-25 ENCOUNTER — Other Ambulatory Visit: Payer: Self-pay

## 2019-10-25 ENCOUNTER — Ambulatory Visit: Payer: Self-pay | Attending: Nurse Practitioner

## 2019-11-26 ENCOUNTER — Other Ambulatory Visit: Payer: Self-pay | Admitting: Nurse Practitioner

## 2019-11-26 DIAGNOSIS — E039 Hypothyroidism, unspecified: Secondary | ICD-10-CM

## 2019-11-26 NOTE — Telephone Encounter (Signed)
Requested Prescriptions  Pending Prescriptions Disp Refills   levothyroxine (SYNTHROID) 25 MCG tablet [Pharmacy Med Name: LEVOTHYROXINE 0.025MG  (25MCG) TAB] 90 tablet 1    Sig: TAKE 1 TABLET(25 MCG) BY MOUTH DAILY BEFORE BREAKFAST     Endocrinology:  Hypothyroid Agents Failed - 11/26/2019  3:44 AM      Failed - TSH needs to be rechecked within 3 months after an abnormal result. Refill until TSH is due.      Passed - TSH in normal range and within 360 days    TSH  Date Value Ref Range Status  04/19/2019 4.320 0.450 - 4.500 uIU/mL Final         Passed - Valid encounter within last 12 months    Recent Outpatient Visits          5 months ago Controlled type 2 diabetes mellitus without complication, without long-term current use of insulin [facility])   [place], [place] W, NP   8 months ago Controlled type 2 diabetes mellitus without complication, without long-term current use of insulin [facility])   [place], [place] W, NP   11 months ago Controlled type 2 diabetes mellitus without complication, without long-term current use of insulin [facility])   [place], RPH-CPP   1 year ago Controlled type 2 diabetes mellitus without complication, without long-term current use of insulin [facility])   [place], Vernia Buff, NP   1 year ago Hypothyroidism, unspecified type   [place], Vernia Buff, NP

## 2019-12-20 NOTE — Progress Notes (Signed)
Amy Maldonado   [phone] [phone]   Clinic Follow up Note   Patient Care Team: Amy Pounds, NP as PCP - General (Nurse Practitioner)  Date of Service:  12/22/2019  CHIEF COMPLAINT: F/u anemia   CURRENT THERAPY:  Continue ferrous sulfate 325 mg oral 3 times a day. IV Feriheme as needed, last given 04/05/19  INTERVAL HISTORY:  Amy Maldonado is here for a follow up of anemia. She ws last seen by me in 06/2018. She was seen by NP Amy Maldonado 9 months ago in interim. She presents to the clinic with her Spanish interpretor Amy Maldonado. She notes she is dong well. She denies feeling more tired than normal. She notes she still has menorrhagia with 3 days of heavy bleeding each cycle. She notes she remains on oral iron TID. She notes when she gave birth 40 years ago she hemorrhaged and required blood transfusion. She notes she lost her hair after that.    REVIEW OF SYSTEMS:   Constitutional: Denies fevers, chills or abnormal weight loss Eyes: Denies blurriness of vision Ears, nose, mouth, throat, and face: Denies mucositis or sore throat Respiratory: Denies cough, dyspnea or wheezes Cardiovascular: Denies palpitation, chest discomfort or lower extremity swelling Gastrointestinal:  Denies nausea, heartburn or change in bowel habits Skin: Denies abnormal skin rashes Lymphatics: Denies new lymphadenopathy or easy bruising Neurological:Denies numbness, tingling or new weaknesses Behavioral/Psych: Mood is stable, no new changes  All other systems were reviewed with the patient and are negative.  MEDICAL HISTORY:  Past Medical History:  Diagnosis Date  . Anemia   . Diabetes mellitus without complication ([place])   . Hypothyroidism 2011    SURGICAL HISTORY: Past Surgical History:  Procedure Laterality Date  . CESAREAN SECTION     3 c section  . TUBAL LIGATION      I have reviewed the social history and family history with the patient and they are  unchanged from previous note.  ALLERGIES:  has No Known Allergies.  MEDICATIONS:  Current Outpatient Medications  Medication Sig Dispense Refill  . atorvastatin (LIPITOR) 20 MG tablet Take 1 tablet (20 mg total) by mouth daily. 90 tablet 3  . Blood Glucose Monitoring Suppl (TRUE METRIX METER) w/Device KIT Use as instructed. 1 kit 0  . ferrous sulfate 325 (65 FE) MG tablet Take 1 tablet (325 mg total) by mouth 3 (three) times daily with meals. 180 tablet 3  . glimepiride (AMARYL) 4 MG tablet Take 1 tablet (4 mg total) by mouth daily before breakfast. 30 tablet 3  . glucose blood (TRUE METRIX BLOOD GLUCOSE TEST) test strip Use as instructed. Monitor blood glucose levels once a day by fingerstick. 100 each 12  . levothyroxine (SYNTHROID) 25 MCG tablet TAKE 1 TABLET(25 MCG) BY MOUTH DAILY BEFORE BREAKFAST 90 tablet 1  . lisinopril (ZESTRIL) 2.5 MG tablet Take 1 tablet (2.5 mg total) by mouth daily. 90 tablet 1  . metFORMIN (GLUCOPHAGE) 500 MG tablet Take 2 tablets (1,000 mg total) by mouth 2 (two) times daily with a meal. 120 tablet 3  . sitaGLIPtin (JANUVIA) 25 MG tablet Take 1 tablet (25 mg total) by mouth daily. 30 tablet 3  . TRUEplus Lancets 28G MISC Use as instructed. Monitor blood glucose levels once a day by fingerstick. 100 each 3   No current facility-administered medications for this visit.    PHYSICAL EXAMINATION: ECOG PERFORMANCE STATUS: 0 - Asymptomatic  Vitals:   12/22/19 1040  BP: 120/70  Pulse: 80  Resp: 18  Temp: (!) 97.1 F (36.2 C)  SpO2: 100%   Filed Weights   12/22/19 1040  Weight: 156 lb 8 oz (71 kg)    Due to COVID19 we will limit examination to appearance. Patient had no complaints.  GENERAL:alert, no distress and comfortable SKIN: skin color normal, no rashes or significant lesions EYES: normal, Conjunctiva are pink and non-injected, sclera clear  NEURO: alert & oriented x 3 with fluent speech  LABORATORY DATA:  I have reviewed the data as  listed CBC Latest Ref Rng & Units 12/22/2019 10/11/2019 06/28/2019  WBC 4.0 - 10.5 K/uL 6.4 9.0 8.5  Hemoglobin 12.0 - 15.0 g/dL 10.8(L) 11.7(L) 11.2(L)  Hematocrit 36.0 - 46.0 % 33.4(L) 36.5 34.5(L)  Platelets 150 - 400 K/uL 294 322 321     CMP Latest Ref Rng & Units 06/14/2019 03/09/2019 11/07/2018  Glucose 65 - 99 mg/dL 114(H) 108(H) 229(H)  BUN 6 - 24 mg/dL _0 Creatinine 0.57 - 1.00 mg/dL 0.53(L) 0.52(L) 0.79  Sodium 134 - 144 mmol/L 142 138 136  Potassium 3.5 - 5.2 mmol/L 4.2 4.3 3.9  Chloride 96 - 106 mmol/L 104 103 103  CO2 20 - 29 mmol/L _1 Calcium 8.7 - 10.2 mg/dL 9.4 9.6 9.6  Total Protein 6.0 - 8.5 g/dL - - 6.9  Total Bilirubin 0.0 - 1.2 mg/dL - - <0.2  Alkaline Phos 39 - 117 IU/L - - 97  AST 0 - 40 IU/L - - 28  ALT 0 - 32 IU/L - - 46(H)      RADIOGRAPHIC STUDIES: I have personally reviewed the radiological images as listed and agreed with the findings in the report. No results found.   ASSESSMENT & PLAN:  Amy Maldonado is a 44 y.o. 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 She has required IV Iron about 3 times a year. She responds to IV iron well. -Although her anemia from Menorrhagia is mostly controlled, Hg will periodically drop even on oral and IV Iron. I suspect she has component of Anemia of Chronic disease from her DM.  -She has not been more tired than baseline. She continues to have monthly periods with Menorrhagia the first 3 days. She is clinically doing well. Labs reviewed today, Hg 10.8. Iron panel still pending.Will proceed with IV Feraheme next week.  -Continue Ferrous sulfate TID  -Monitor with lab every 3 months. F/u in 9 months   2. Hypothyroidism -She's been on 25 mg Synthroid. Stable.   3.Diabetes -She is  currently on Metformin. She will continue to follow up with her PCP.  4.Cancer screening: Continue age appropriate cancer screenings   Plan -Proceed with IV Feraheme next week if ferritin <30 or low iron saturation  -Lab every 3 months X3  -f/u in 9 months -Continue oral iron TID   No problem-specific Assessment & Plan notes found for this encounter.   No orders of the defined types were placed in this encounter.  All questions were answered. The patient knows to call the clinic with any problems, questions or concerns. No barriers to learning was detected. The total time spent in the appointment was 25 minutes.     Truitt Merle, MD 12/22/2019   I, Joslyn Devon, am acting as scribe  for Truitt Merle, MD.   I have reviewed the above documentation for accuracy and completeness, and I agree with the above.

## 2019-12-22 ENCOUNTER — Other Ambulatory Visit: Payer: Self-pay

## 2019-12-22 ENCOUNTER — Inpatient Hospital Stay: Payer: No Typology Code available for payment source

## 2019-12-22 ENCOUNTER — Encounter: Payer: Self-pay | Admitting: Hematology

## 2019-12-22 ENCOUNTER — Telehealth: Payer: Self-pay | Admitting: Hematology

## 2019-12-22 ENCOUNTER — Inpatient Hospital Stay: Payer: Self-pay | Attending: Hematology | Admitting: Hematology

## 2019-12-22 VITALS — BP 120/70 | HR 80 | Temp 97.1°F | Resp 18 | Ht 60.0 in | Wt 156.5 lb

## 2019-12-22 DIAGNOSIS — N920 Excessive and frequent menstruation with regular cycle: Secondary | ICD-10-CM | POA: Insufficient documentation

## 2019-12-22 DIAGNOSIS — D500 Iron deficiency anemia secondary to blood loss (chronic): Secondary | ICD-10-CM

## 2019-12-22 LAB — CBC WITH DIFFERENTIAL (CANCER CENTER ONLY)
Abs Immature Granulocytes: 0.02 10*3/uL (ref 0.00–0.07)
Basophils Absolute: 0 10*3/uL (ref 0.0–0.1)
Basophils Relative: 0 %
Eosinophils Absolute: 0.1 10*3/uL (ref 0.0–0.5)
Eosinophils Relative: 2 %
HCT: 33.4 % — ABNORMAL LOW (ref 36.0–46.0)
Hemoglobin: 10.8 g/dL — ABNORMAL LOW (ref 12.0–15.0)
Immature Granulocytes: 0 %
Lymphocytes Relative: 25 %
Lymphs Abs: 1.6 10*3/uL (ref 0.7–4.0)
MCH: 28.7 pg (ref 26.0–34.0)
MCHC: 32.3 g/dL (ref 30.0–36.0)
MCV: 88.8 fL (ref 80.0–100.0)
Monocytes Absolute: 0.5 10*3/uL (ref 0.1–1.0)
Monocytes Relative: 8 %
Neutro Abs: 4.1 10*3/uL (ref 1.7–7.7)
Neutrophils Relative %: 65 %
Platelet Count: 294 10*3/uL (ref 150–400)
RBC: 3.76 MIL/uL — ABNORMAL LOW (ref 3.87–5.11)
RDW: 15.8 % — ABNORMAL HIGH (ref 11.5–15.5)
WBC Count: 6.4 10*3/uL (ref 4.0–10.5)
nRBC: 0 % (ref 0.0–0.2)

## 2019-12-22 LAB — FERRITIN: Ferritin: 27 ng/mL (ref 11–307)

## 2019-12-22 LAB — IRON AND TIBC
Iron: 26 ug/dL — ABNORMAL LOW (ref 41–142)
Saturation Ratios: 7 % — ABNORMAL LOW (ref 21–57)
TIBC: 363 ug/dL (ref 236–444)
UIBC: 337 ug/dL (ref 120–384)

## 2019-12-22 NOTE — Telephone Encounter (Signed)
Scheduled per 12/10 los, patient received updated calender.

## 2019-12-26 ENCOUNTER — Telehealth: Payer: Self-pay | Admitting: Hematology

## 2019-12-26 NOTE — Telephone Encounter (Signed)
Interpreter ID #[id number] Lennette Bihari   Called pt per 12/11 sch msg -  Pt is aware of appt date and time

## 2019-12-26 NOTE — Progress Notes (Signed)
..  Feraheme is approved for refills. Effective 04/13/2019 to 04/09/2020. Additional refills requested 12/25/2019. DOS: 01/10/2020,      Next DOS: 01/17/2020. Refills, application needed with sections 1,3,4,5,6 filled out and sent in for approval.

## 2020-01-08 ENCOUNTER — Ambulatory Visit: Payer: No Typology Code available for payment source

## 2020-01-10 ENCOUNTER — Inpatient Hospital Stay: Payer: Self-pay

## 2020-01-10 ENCOUNTER — Other Ambulatory Visit: Payer: Self-pay

## 2020-01-10 VITALS — BP 120/71 | HR 61 | Temp 98.2°F | Resp 16

## 2020-01-10 DIAGNOSIS — D500 Iron deficiency anemia secondary to blood loss (chronic): Secondary | ICD-10-CM

## 2020-01-10 MED ORDER — SODIUM CHLORIDE 0.9 % IV SOLN
510.0000 mg | Freq: Once | INTRAVENOUS | Status: AC
  Administered 2020-01-10: 510 mg via INTRAVENOUS
  Filled 2020-01-10: qty 510

## 2020-01-10 MED ORDER — SODIUM CHLORIDE 0.9 % IV SOLN
Freq: Once | INTRAVENOUS | Status: AC
Start: 2020-01-10 — End: 2020-01-10
  Filled 2020-01-10: qty 250

## 2020-01-10 NOTE — Patient Instructions (Addendum)
Ferumoxytol injection Qu es este medicamento? El FERUMOXITOL es un complejo de hierro. El hierro se usa para producir glbulos rojos saludables, que transportan oxgeno y nutrientes por el cuerpo. Este medicamento se usa para tratar la anemia por deficiencia de hierro. Este medicamento puede ser utilizado para otros usos; si tiene alguna pregunta consulte con su proveedor de atencin mdica o con su farmacutico. MARCAS COMUNES: Feraheme Qu le debo informar a mi profesional de la salud antes de tomar este medicamento? Necesita saber si usted presenta alguno de los siguientes problemas o situaciones:  anemia no provocada por niveles bajos de hierro  niveles altos de hierro en la sangre  examen de imgenes por resonancia magntica (IRM) programado  una reaccin alrgica o inusual al hierro, otros medicamentos, alimentos, colorantes o conservantes  si est embarazada o buscando quedar embarazada  si est amamantando a un beb Cmo debo utilizar este medicamento? Este medicamento se administra mediante inyeccin por va intravenosa. Lo administra un profesional de la salud en un hospital o en un entorno clnico. Hable con su pediatra para informarse acerca del uso de este medicamento en nios. Puede requerir atencin especial. Sobredosis: Pngase en contacto inmediatamente con un centro toxicolgico o una sala de urgencia si usted cree que haya tomado demasiado medicamento. ATENCIN: Este medicamento es solo para usted. No comparta este medicamento con nadie. Qu sucede si me olvido de una dosis? Es importante no olvidar ninguna dosis. Informe a su mdico o a su profesional de la salud si no puede asistir a una cita. Qu puede interactuar con este medicamento? Esta medicina puede interactuar con los siguientes medicamentos:  otros productos de hierro Puede ser que esta lista no menciona todas las posibles interacciones. Informe a su profesional de la salud de todos los productos a  base de hierbas, medicamentos de venta libre o suplementos nutritivos que est tomando. Si usted fuma, consume bebidas alcohlicas o si utiliza drogas ilegales, indqueselo tambin a su profesional de la salud. Algunas sustancias pueden interactuar con su medicamento. A qu debo estar atento al usar este medicamento? Visite a su mdico o a su profesional de la salud de manera regular. Si los sntomas no comienzan a mejorar o si empeoran, consulte con su mdico o con su profesional de la salud. Tal vez necesita realizarse anlisis de sangre mientras recibe este medicamento. Tal vez necesita seguir una dieta especial. Consulte a su mdico. Los alimentos que contienen hierro incluyen: alimentos integrales o con cereales, frutas secas, frijoles o arvejas, vegetales de hoja verde y carne que proviene de rganos (hgado, rin). Qu efectos secundarios puedo tener al utilizar este medicamento? Efectos secundarios que debe informar a su mdico o a su profesional de la salud tan pronto como sea posible: reacciones alrgicas, como erupcin cutnea, picazn o urticarias, e hinchazn de la cara, los labios o la lengua problemas respiratorios cambios en la presin sangunea sensacin de desmayos o aturdimiento, cadas fiebre o escalofros enrojecimiento, sudoracin o sensacin de calor hinchazn de tobillos o pies Efectos secundarios que generalmente no requieren atencin mdica (infrmelos a su mdico o a su profesional de la salud si persisten o si son molestos): diarrea dolor de cabeza nuseas, vmito dolor estomacal Puede ser que esta lista no menciona todos los posibles efectos secundarios. Comunquese a su mdico por asesoramiento mdico sobre los efectos secundarios. Usted puede informar los efectos secundarios a la FDA por telfono al 1-800-FDA-1088. Dnde debo guardar mi medicina? Este medicamento se administra en hospitales o clnicas y no necesitar guardarlo   en su domicilio. ATENCIN: Este folleto es un  resumen. Puede ser que no cubra toda la posible informacin. Si usted tiene preguntas acerca de esta medicina, consulte con su mdico, su farmacutico o su profesional de la salud.  2020 Elsevier/Gold Standard (2016-04-21 00:00:00)   

## 2020-01-15 ENCOUNTER — Ambulatory Visit: Payer: No Typology Code available for payment source

## 2020-01-17 ENCOUNTER — Inpatient Hospital Stay: Payer: No Typology Code available for payment source | Attending: Hematology

## 2020-01-17 ENCOUNTER — Other Ambulatory Visit: Payer: Self-pay

## 2020-01-17 VITALS — BP 115/72 | HR 69 | Temp 98.3°F | Resp 16

## 2020-01-17 DIAGNOSIS — D500 Iron deficiency anemia secondary to blood loss (chronic): Secondary | ICD-10-CM | POA: Insufficient documentation

## 2020-01-17 DIAGNOSIS — N920 Excessive and frequent menstruation with regular cycle: Secondary | ICD-10-CM | POA: Insufficient documentation

## 2020-01-17 MED ORDER — SODIUM CHLORIDE 0.9 % IV SOLN
Freq: Once | INTRAVENOUS | Status: AC
  Filled 2020-01-17: qty 250

## 2020-01-17 MED ORDER — SODIUM CHLORIDE 0.9 % IV SOLN
510.0000 mg | Freq: Once | INTRAVENOUS | Status: AC
  Administered 2020-01-17: 510 mg via INTRAVENOUS
  Filled 2020-01-17: qty 17

## 2020-01-17 NOTE — Patient Instructions (Signed)
Ferumoxytol injection Qu es este medicamento? El FERUMOXITOL es un complejo de hierro. El hierro se usa para producir glbulos rojos saludables, que transportan oxgeno y nutrientes por el cuerpo. Este medicamento se usa para tratar la anemia por deficiencia de hierro. Este medicamento puede ser utilizado para otros usos; si tiene alguna pregunta consulte con su proveedor de atencin mdica o con su farmacutico. MARCAS COMUNES: Feraheme Qu le debo informar a mi profesional de la salud antes de tomar este medicamento? Necesita saber si usted presenta alguno de los siguientes problemas o situaciones:  anemia no provocada por niveles bajos de hierro  niveles altos de hierro en la sangre  examen de imgenes por resonancia magntica (IRM) programado  una reaccin alrgica o inusual al hierro, otros medicamentos, alimentos, colorantes o conservantes  si est embarazada o buscando quedar embarazada  si est amamantando a un beb Cmo debo utilizar este medicamento? Este medicamento se administra mediante inyeccin por va intravenosa. Lo administra un profesional de la salud en un hospital o en un entorno clnico. Hable con su pediatra para informarse acerca del uso de este medicamento en nios. Puede requerir atencin especial. Sobredosis: Pngase en contacto inmediatamente con un centro toxicolgico o una sala de urgencia si usted cree que haya tomado demasiado medicamento. ATENCIN: Este medicamento es solo para usted. No comparta este medicamento con nadie. Qu sucede si me olvido de una dosis? Es importante no olvidar ninguna dosis. Informe a su mdico o a su profesional de la salud si no puede asistir a una cita. Qu puede interactuar con este medicamento? Esta medicina puede interactuar con los siguientes medicamentos:  otros productos de hierro Puede ser que esta lista no menciona todas las posibles interacciones. Informe a su profesional de la salud de todos los productos a  base de hierbas, medicamentos de venta libre o suplementos nutritivos que est tomando. Si usted fuma, consume bebidas alcohlicas o si utiliza drogas ilegales, indqueselo tambin a su profesional de la salud. Algunas sustancias pueden interactuar con su medicamento. A qu debo estar atento al usar este medicamento? Visite a su mdico o a su profesional de la salud de manera regular. Si los sntomas no comienzan a mejorar o si empeoran, consulte con su mdico o con su profesional de la salud. Tal vez necesita realizarse anlisis de sangre mientras recibe este medicamento. Tal vez necesita seguir una dieta especial. Consulte a su mdico. Los alimentos que contienen hierro incluyen: alimentos integrales o con cereales, frutas secas, frijoles o arvejas, vegetales de hoja verde y carne que proviene de rganos (hgado, rin). Qu efectos secundarios puedo tener al utilizar este medicamento? Efectos secundarios que debe informar a su mdico o a su profesional de la salud tan pronto como sea posible: reacciones alrgicas, como erupcin cutnea, picazn o urticarias, e hinchazn de la cara, los labios o la lengua problemas respiratorios cambios en la presin sangunea sensacin de desmayos o aturdimiento, cadas fiebre o escalofros enrojecimiento, sudoracin o sensacin de calor hinchazn de tobillos o pies Efectos secundarios que generalmente no requieren atencin mdica (infrmelos a su mdico o a su profesional de la salud si persisten o si son molestos): diarrea dolor de cabeza nuseas, vmito dolor estomacal Puede ser que esta lista no menciona todos los posibles efectos secundarios. Comunquese a su mdico por asesoramiento mdico sobre los efectos secundarios. Usted puede informar los efectos secundarios a la FDA por telfono al 1-800-FDA-1088. Dnde debo guardar mi medicina? Este medicamento se administra en hospitales o clnicas y no necesitar guardarlo   en su domicilio. ATENCIN: Este folleto es un  resumen. Puede ser que no cubra toda la posible informacin. Si usted tiene preguntas acerca de esta medicina, consulte con su mdico, su farmacutico o su profesional de la salud.  2020 Elsevier/Gold Standard (2016-04-21 00:00:00)   

## 2020-03-20 ENCOUNTER — Inpatient Hospital Stay: Payer: No Typology Code available for payment source

## 2020-03-27 ENCOUNTER — Other Ambulatory Visit: Payer: Self-pay

## 2020-03-27 ENCOUNTER — Inpatient Hospital Stay: Payer: No Typology Code available for payment source | Attending: Hematology

## 2020-03-27 DIAGNOSIS — N920 Excessive and frequent menstruation with regular cycle: Secondary | ICD-10-CM | POA: Insufficient documentation

## 2020-03-27 DIAGNOSIS — D500 Iron deficiency anemia secondary to blood loss (chronic): Secondary | ICD-10-CM | POA: Insufficient documentation

## 2020-03-27 LAB — CBC WITH DIFFERENTIAL (CANCER CENTER ONLY)
Abs Immature Granulocytes: 0.03 10*3/uL (ref 0.00–0.07)
Basophils Absolute: 0 10*3/uL (ref 0.0–0.1)
Basophils Relative: 0 %
Eosinophils Absolute: 0.1 10*3/uL (ref 0.0–0.5)
Eosinophils Relative: 1 %
HCT: 37 % (ref 36.0–46.0)
Hemoglobin: 12 g/dL (ref 12.0–15.0)
Immature Granulocytes: 0 %
Lymphocytes Relative: 18 %
Lymphs Abs: 1.5 10*3/uL (ref 0.7–4.0)
MCH: 29.8 pg (ref 26.0–34.0)
MCHC: 32.4 g/dL (ref 30.0–36.0)
MCV: 91.8 fL (ref 80.0–100.0)
Monocytes Absolute: 0.7 10*3/uL (ref 0.1–1.0)
Monocytes Relative: 8 %
Neutro Abs: 5.9 10*3/uL (ref 1.7–7.7)
Neutrophils Relative %: 73 %
Platelet Count: 308 10*3/uL (ref 150–400)
RBC: 4.03 MIL/uL (ref 3.87–5.11)
RDW: 15 % (ref 11.5–15.5)
WBC Count: 8.3 10*3/uL (ref 4.0–10.5)
nRBC: 0 % (ref 0.0–0.2)

## 2020-03-27 LAB — IRON AND TIBC
Iron: 50 ug/dL (ref 41–142)
Saturation Ratios: 15 % — ABNORMAL LOW (ref 21–57)
TIBC: 329 ug/dL (ref 236–444)
UIBC: 279 ug/dL (ref 120–384)

## 2020-03-27 LAB — FERRITIN: Ferritin: 37 ng/mL (ref 11–307)

## 2020-04-01 ENCOUNTER — Telehealth: Payer: Self-pay

## 2020-04-01 NOTE — Telephone Encounter (Signed)
Error

## 2020-04-01 NOTE — Telephone Encounter (Signed)
-----   Message from Truitt Merle, MD sent at 03/28/2020  7:23 AM EDT ----- Please let pt know her lab results, anemia resolved, iron level good, continue monitoring, no need for iv iron now, thanks  Truitt Merle

## 2020-04-01 NOTE — Telephone Encounter (Signed)
I reviewed Dr Lewayne Bunting comments and recommendations with Ms Arkin via translator Lavon Paganini.

## 2020-04-11 NOTE — Progress Notes (Signed)
..  The following Assist/Replace Program for Feraheme from [place] has been terminated due to program expired 04/09/2020.  Last DOS: 01/17/2020.

## 2020-05-15 ENCOUNTER — Other Ambulatory Visit: Payer: Self-pay

## 2020-05-15 ENCOUNTER — Ambulatory Visit: Payer: Self-pay | Attending: Nurse Practitioner

## 2020-06-03 ENCOUNTER — Other Ambulatory Visit: Payer: Self-pay | Admitting: Nurse Practitioner

## 2020-06-19 ENCOUNTER — Ambulatory Visit: Payer: Self-pay | Attending: Nurse Practitioner

## 2020-06-19 ENCOUNTER — Inpatient Hospital Stay: Payer: No Typology Code available for payment source | Attending: Hematology

## 2020-06-19 ENCOUNTER — Other Ambulatory Visit: Payer: Self-pay

## 2020-06-19 DIAGNOSIS — N920 Excessive and frequent menstruation with regular cycle: Secondary | ICD-10-CM | POA: Insufficient documentation

## 2020-06-19 DIAGNOSIS — D500 Iron deficiency anemia secondary to blood loss (chronic): Secondary | ICD-10-CM | POA: Insufficient documentation

## 2020-06-19 LAB — CBC WITH DIFFERENTIAL (CANCER CENTER ONLY)
Abs Immature Granulocytes: 0.03 10*3/uL (ref 0.00–0.07)
Basophils Absolute: 0 10*3/uL (ref 0.0–0.1)
Basophils Relative: 0 %
Eosinophils Absolute: 0.1 10*3/uL (ref 0.0–0.5)
Eosinophils Relative: 1 %
HCT: 31.8 % — ABNORMAL LOW (ref 36.0–46.0)
Hemoglobin: 9.5 g/dL — ABNORMAL LOW (ref 12.0–15.0)
Immature Granulocytes: 0 %
Lymphocytes Relative: 21 %
Lymphs Abs: 1.9 10*3/uL (ref 0.7–4.0)
MCH: 25.3 pg — ABNORMAL LOW (ref 26.0–34.0)
MCHC: 29.9 g/dL — ABNORMAL LOW (ref 30.0–36.0)
MCV: 84.6 fL (ref 80.0–100.0)
Monocytes Absolute: 0.8 10*3/uL (ref 0.1–1.0)
Monocytes Relative: 9 %
Neutro Abs: 6.1 10*3/uL (ref 1.7–7.7)
Neutrophils Relative %: 69 %
Platelet Count: 327 10*3/uL (ref 150–400)
RBC: 3.76 MIL/uL — ABNORMAL LOW (ref 3.87–5.11)
RDW: 17.9 % — ABNORMAL HIGH (ref 11.5–15.5)
WBC Count: 9 10*3/uL (ref 4.0–10.5)
nRBC: 0 % (ref 0.0–0.2)

## 2020-06-20 LAB — IRON AND TIBC
Iron: 32 ug/dL — ABNORMAL LOW (ref 41–142)
Saturation Ratios: 8 % — ABNORMAL LOW (ref 21–57)
TIBC: 395 ug/dL (ref 236–444)
UIBC: 363 ug/dL (ref 120–384)

## 2020-06-20 LAB — FERRITIN: Ferritin: 71 ng/mL (ref 11–307)

## 2020-07-17 ENCOUNTER — Other Ambulatory Visit: Payer: Self-pay

## 2020-07-17 ENCOUNTER — Encounter: Payer: Self-pay | Admitting: Hematology

## 2020-07-17 ENCOUNTER — Encounter (INDEPENDENT_AMBULATORY_CARE_PROVIDER_SITE_OTHER): Payer: Self-pay

## 2020-07-17 ENCOUNTER — Ambulatory Visit: Payer: Self-pay | Attending: Nurse Practitioner | Admitting: Nurse Practitioner

## 2020-07-17 VITALS — BP 119/82 | HR 84 | Temp 98.7°F | Resp 16 | Wt 147.0 lb

## 2020-07-17 DIAGNOSIS — E785 Hyperlipidemia, unspecified: Secondary | ICD-10-CM

## 2020-07-17 DIAGNOSIS — E039 Hypothyroidism, unspecified: Secondary | ICD-10-CM

## 2020-07-17 DIAGNOSIS — E119 Type 2 diabetes mellitus without complications: Secondary | ICD-10-CM

## 2020-07-17 LAB — POCT GLYCOSYLATED HEMOGLOBIN (HGB A1C): HbA1c, POC (controlled diabetic range): 11.9 % — AB (ref 0.0–7.0)

## 2020-07-17 LAB — POCT CBG (FASTING - GLUCOSE)-MANUAL ENTRY: Glucose Fasting, POC: 319 mg/dL — AB (ref 70–99)

## 2020-07-17 MED ORDER — TRUE METRIX METER W/DEVICE KIT
PACK | 0 refills | Status: AC
  Filled 2020-07-17: qty 1, 365d supply, fill #0

## 2020-07-17 MED ORDER — SITAGLIPTIN PHOSPHATE 50 MG PO TABS
50.0000 mg | ORAL_TABLET | Freq: Every day | ORAL | 1 refills | Status: DC
  Filled 2020-07-17: qty 30, 30d supply, fill #0

## 2020-07-17 MED ORDER — TRUEPLUS LANCETS 28G MISC
3 refills | Status: DC
  Filled 2020-07-17: qty 100, 50d supply, fill #0

## 2020-07-17 MED ORDER — LISINOPRIL 2.5 MG PO TABS
2.5000 mg | ORAL_TABLET | Freq: Every day | ORAL | 1 refills | Status: DC
  Filled 2020-07-17: qty 30, 30d supply, fill #0

## 2020-07-17 MED ORDER — LEVOTHYROXINE SODIUM 25 MCG PO TABS
25.0000 ug | ORAL_TABLET | Freq: Every day | ORAL | 1 refills | Status: DC
  Filled 2020-07-17: qty 30, 30d supply, fill #0

## 2020-07-17 MED ORDER — GLIMEPIRIDE 4 MG PO TABS
4.0000 mg | ORAL_TABLET | Freq: Every day | ORAL | 1 refills | Status: DC
  Filled 2020-07-17: qty 30, 30d supply, fill #0

## 2020-07-17 MED ORDER — METFORMIN HCL 500 MG PO TABS
1000.0000 mg | ORAL_TABLET | Freq: Two times a day (BID) | ORAL | 1 refills | Status: DC
  Filled 2020-07-17: qty 120, 30d supply, fill #0

## 2020-07-17 MED ORDER — ATORVASTATIN CALCIUM 20 MG PO TABS
20.0000 mg | ORAL_TABLET | Freq: Every day | ORAL | 3 refills | Status: DC
  Filled 2020-07-17: qty 30, 30d supply, fill #0

## 2020-07-17 MED ORDER — TRUE METRIX BLOOD GLUCOSE TEST VI STRP
ORAL_STRIP | 12 refills | Status: DC
  Filled 2020-07-17: qty 100, 50d supply, fill #0

## 2020-07-17 NOTE — Progress Notes (Signed)
F/u DM

## 2020-07-17 NOTE — Progress Notes (Signed)
Assessment & Plan:  Amy Maldonado was seen today for diabetes and asthma.  Diagnoses and all orders for this visit:  Controlled type 2 diabetes mellitus without complication, without long-term current use of insulin (HCC) -     HgB A1c -     Glucose (CBG), Fasting -     glimepiride (AMARYL) 4 MG tablet; Take 1 tablet (4 mg total) by mouth daily before breakfast. NEEDS PASS -     lisinopril (ZESTRIL) 2.5 MG tablet; Take 1 tablet (2.5 mg total) by mouth daily. NEEDS PASS -     metFORMIN (GLUCOPHAGE) 500 MG tablet; Take 2 tablets (1,000 mg total) by mouth 2 (two) times daily with a meal. NEEDS PASS -     TRUEplus Lancets 28G MISC; Use as instructed. Monitor blood glucose levels twice a day by fingerstick. -     glucose blood (TRUE METRIX BLOOD GLUCOSE TEST) test strip; Use as instructed. Monitor blood glucose levels twice a day by fingerstick. -     Blood Glucose Monitoring Suppl (TRUE METRIX METER) w/Device KIT; Use as instructed. -     sitaGLIPtin (JANUVIA) 50 MG tablet; Take 1 tablet (50 mg total) by mouth daily. NEEDS PASS Continue blood sugar control as discussed in office today, low carbohydrate diet, and regular physical exercise as tolerated, 150 minutes per week (30 min each day, 5 days per week, or 50 min 3 days per week). Keep blood sugar logs with fasting goal of 90-130 mg/dl, post prandial (after you eat) less than 180.  For Hypoglycemia: BS <60 and Hyperglycemia BS >400; contact the clinic ASAP. Annual eye exams and foot exams are recommended.   Hyperlipidemia LDL goal <70 -     atorvastatin (LIPITOR) 20 MG tablet; Take 1 tablet (20 mg total) by mouth daily. NEED PASS INSTRUCTIONS: Work on a low fat, heart healthy diet and participate in regular aerobic exercise program by working out at least 150 minutes per week; 5 days a week-30 minutes per day. Avoid red meat/beef/steak,  fried foods. junk foods, sodas, sugary drinks, unhealthy snacking, alcohol and smoking.  Drink at least 80  oz of water per day and monitor your carbohydrate intake daily.    Hypothyroidism, unspecified type -     levothyroxine (SYNTHROID) 25 MCG tablet; Take 1 tablet (25 mcg total) by mouth daily before breakfast. NEEDS PASS   Patient has been counseled on age-appropriate routine health concerns for screening and prevention. These are reviewed and up-to-date. Referrals have been placed accordingly. Immunizations are up-to-date or declined.    Subjective:   Chief Complaint  Patient presents with   Diabetes   Asthma   HPI Amy Maldonado 45 y.o. female presents to office today for follow up. She has a past medical history of Anemia, DM2, and Hypothyroidism (2011).  VRI was used to communicate directly with patient for the entire encounter including providing detailed patient instructions.     DM2 She has been out of her medications for 8 months. States she could not afford them. Will refill metformin 1000 mg BID and Amaryl 4 mg daily. Will increase januvia to 50 mg from 25 mg. Will have her follow up in 4 weeks. If glucose readings continue elevated in the 200s will need to start  and start low dose lantus 10 units nightly.  Lab Results  Component Value Date   HGBA1C 11.9 (A) 07/17/2020    Lab Results  Component Value Date   HGBA1C 8.5 (H) 06/14/2019  Lab Results  Component Value Date   LDLCALC 84 06/14/2019     Hypothyroidism Well controlled with synthroid 25 mg daily.  Lab Results  Component Value Date   TSH 4.320 04/19/2019      Review of Systems  Constitutional:  Negative for fever, malaise/fatigue and weight loss.  HENT: Negative.  Negative for nosebleeds.   Eyes: Negative.  Negative for blurred vision, double vision and photophobia.  Respiratory: Negative.  Negative for cough and shortness of breath.   Cardiovascular: Negative.  Negative for chest pain, palpitations and leg swelling.  Gastrointestinal: Negative.  Negative for heartburn, nausea and vomiting.   Musculoskeletal: Negative.  Negative for myalgias.  Neurological: Negative.  Negative for dizziness, focal weakness, seizures and headaches.  Psychiatric/Behavioral: Negative.  Negative for suicidal ideas.    Past Medical History:  Diagnosis Date   Anemia    Diabetes mellitus without complication (HCC)    Hypothyroidism 2011    Past Surgical History:  Procedure Laterality Date   CESAREAN SECTION     3 c section   TUBAL LIGATION      Family History  Problem Relation Age of Onset   Hypertension Mother    Diabetes Father    Hypertension Father    Cancer Father        prostate cancer    Social History Reviewed with no changes to be made today.   Outpatient Medications Prior to Visit  Medication Sig Dispense Refill   ferrous sulfate 325 (65 FE) MG tablet Take 1 tablet (325 mg total) by mouth 3 (three) times daily with meals. 180 tablet 3   atorvastatin (LIPITOR) 20 MG tablet Take 1 tablet (20 mg total) by mouth daily. 90 tablet 3   glimepiride (AMARYL) 4 MG tablet Take 1 tablet (4 mg total) by mouth daily before breakfast. 30 tablet 3   JANUVIA 25 MG tablet TAKE 1 TABLET BY MOUTH DAILY 90 tablet 0   levothyroxine (SYNTHROID) 25 MCG tablet TAKE 1 TABLET(25 MCG) BY MOUTH DAILY BEFORE BREAKFAST 90 tablet 1   lisinopril (ZESTRIL) 2.5 MG tablet Take 1 tablet (2.5 mg total) by mouth daily. 90 tablet 1   TRUEplus Lancets 28G MISC Use as instructed. Monitor blood glucose levels once a day by fingerstick. 100 each 3   Blood Glucose Monitoring Suppl (TRUE METRIX METER) w/Device KIT Use as instructed. 1 kit 0   glucose blood (TRUE METRIX BLOOD GLUCOSE TEST) test strip Use as instructed. Monitor blood glucose levels once a day by fingerstick. 100 each 12   metFORMIN (GLUCOPHAGE) 500 MG tablet Take 2 tablets (1,000 mg total) by mouth 2 (two) times daily with a meal. 120 tablet 3   No facility-administered medications prior to visit.    No Known Allergies     Objective:    BP 119/82    Pulse 84   Temp 98.7 F (37.1 C)   Resp 16   Wt 147 lb (66.7 kg)   SpO2 100%   BMI 28.71 kg/m  Wt Readings from Last 3 Encounters:  07/17/20 147 lb (66.7 kg)  12/22/19 156 lb 8 oz (71 kg)  09/26/19 157 lb 6.4 oz (71.4 kg)    Physical Exam Vitals and nursing note reviewed.  Constitutional:      Appearance: She is well-developed.  HENT:     Head: Normocephalic and atraumatic.  Cardiovascular:     Rate and Rhythm: Normal rate and regular rhythm.     Heart sounds: Normal heart sounds. No murmur   heard.   No friction rub. No gallop.  Pulmonary:     Effort: Pulmonary effort is normal. No tachypnea or respiratory distress.     Breath sounds: Normal breath sounds. No decreased breath sounds, wheezing, rhonchi or rales.  Chest:     Chest wall: No tenderness.  Abdominal:     General: Bowel sounds are normal.     Palpations: Abdomen is soft.  Musculoskeletal:        General: Normal range of motion.     Cervical back: Normal range of motion.  Skin:    General: Skin is warm and dry.  Neurological:     Mental Status: She is alert and oriented to person, place, and time.     Coordination: Coordination normal.  Psychiatric:        Behavior: Behavior normal. Behavior is cooperative.        Thought Content: Thought content normal.        Judgment: Judgment normal.         Patient has been counseled extensively about nutrition and exercise as well as the importance of adherence with medications and regular follow-up. The patient was given clear instructions to go to ER or return to medical center if symptoms don't improve, worsen or new problems develop. The patient verbalized understanding.   Follow-up: Return in about 4 weeks (around 08/14/2020) for METER CHECK WITH [facility]. See me in 3 months.   Zelda W Fleming, FNP-BC [facility] [place], [place] [phone]   07/19/2020, 10:02 PM  

## 2020-07-19 ENCOUNTER — Encounter: Payer: Self-pay | Admitting: Nurse Practitioner

## 2020-08-07 ENCOUNTER — Ambulatory Visit: Payer: No Typology Code available for payment source | Admitting: Pharmacist

## 2020-08-12 ENCOUNTER — Telehealth (INDEPENDENT_AMBULATORY_CARE_PROVIDER_SITE_OTHER): Payer: Self-pay

## 2020-08-12 NOTE — Telephone Encounter (Signed)
Called patient with pacific interpreter ID [id number] to reschedule appointment with [facility]. Patient did not answer. Left voicemail asking patient to return call to reschedule appt. Nat Christen, CMA     Copied from [place] [phone]. Topic: General - Other >> Aug 12, 2020  3:14 PM Yvette Rack wrote: Reason for CRM: Pt stated she is not feeling well and will need to reschedule the appt with [facility]. Pt requests call back

## 2020-08-14 ENCOUNTER — Ambulatory Visit: Payer: No Typology Code available for payment source | Admitting: Pharmacist

## 2020-08-16 ENCOUNTER — Other Ambulatory Visit: Payer: Self-pay

## 2020-09-20 ENCOUNTER — Inpatient Hospital Stay: Payer: No Typology Code available for payment source | Attending: Hematology

## 2020-09-20 ENCOUNTER — Other Ambulatory Visit: Payer: Self-pay

## 2020-09-20 ENCOUNTER — Inpatient Hospital Stay (HOSPITAL_BASED_OUTPATIENT_CLINIC_OR_DEPARTMENT_OTHER): Payer: Self-pay | Admitting: Hematology

## 2020-09-20 ENCOUNTER — Encounter: Payer: Self-pay | Admitting: Hematology

## 2020-09-20 ENCOUNTER — Inpatient Hospital Stay: Payer: No Typology Code available for payment source

## 2020-09-20 VITALS — BP 123/70 | HR 72 | Temp 98.3°F | Ht 60.0 in | Wt 148.1 lb

## 2020-09-20 VITALS — BP 106/57 | HR 70 | Resp 16

## 2020-09-20 DIAGNOSIS — D500 Iron deficiency anemia secondary to blood loss (chronic): Secondary | ICD-10-CM

## 2020-09-20 DIAGNOSIS — D509 Iron deficiency anemia, unspecified: Secondary | ICD-10-CM

## 2020-09-20 DIAGNOSIS — N920 Excessive and frequent menstruation with regular cycle: Secondary | ICD-10-CM | POA: Insufficient documentation

## 2020-09-20 LAB — CBC WITH DIFFERENTIAL (CANCER CENTER ONLY)
Abs Immature Granulocytes: 0.01 10*3/uL (ref 0.00–0.07)
Basophils Absolute: 0 10*3/uL (ref 0.0–0.1)
Basophils Relative: 0 %
Eosinophils Absolute: 0.1 10*3/uL (ref 0.0–0.5)
Eosinophils Relative: 1 %
HCT: 31.2 % — ABNORMAL LOW (ref 36.0–46.0)
Hemoglobin: 9.1 g/dL — ABNORMAL LOW (ref 12.0–15.0)
Immature Granulocytes: 0 %
Lymphocytes Relative: 24 %
Lymphs Abs: 1.9 10*3/uL (ref 0.7–4.0)
MCH: 22.1 pg — ABNORMAL LOW (ref 26.0–34.0)
MCHC: 29.2 g/dL — ABNORMAL LOW (ref 30.0–36.0)
MCV: 75.7 fL — ABNORMAL LOW (ref 80.0–100.0)
Monocytes Absolute: 0.7 10*3/uL (ref 0.1–1.0)
Monocytes Relative: 8 %
Neutro Abs: 5.5 10*3/uL (ref 1.7–7.7)
Neutrophils Relative %: 67 %
Platelet Count: 452 10*3/uL — ABNORMAL HIGH (ref 150–400)
RBC: 4.12 MIL/uL (ref 3.87–5.11)
RDW: 17.2 % — ABNORMAL HIGH (ref 11.5–15.5)
WBC Count: 8.2 10*3/uL (ref 4.0–10.5)
nRBC: 0 % (ref 0.0–0.2)

## 2020-09-20 LAB — IRON AND TIBC
Iron: 23 ug/dL — ABNORMAL LOW (ref 41–142)
Saturation Ratios: 5 % — ABNORMAL LOW (ref 21–57)
TIBC: 483 ug/dL — ABNORMAL HIGH (ref 236–444)
UIBC: 460 ug/dL — ABNORMAL HIGH (ref 120–384)

## 2020-09-20 LAB — FERRITIN: Ferritin: <4 ng/mL — ABNORMAL LOW (ref 11–307)

## 2020-09-20 MED ORDER — FERROUS SULFATE 325 (65 FE) MG PO TABS: 325.0000 mg | ORAL_TABLET | Freq: Three times a day (TID) | ORAL | 3 refills | Status: DC

## 2020-09-20 MED ORDER — SODIUM CHLORIDE 0.9 % IV SOLN
510.0000 mg | Freq: Once | INTRAVENOUS | Status: AC
  Administered 2020-09-20: 510 mg via INTRAVENOUS
  Filled 2020-09-20: qty 510

## 2020-09-20 MED ORDER — SODIUM CHLORIDE 0.9 % IV SOLN: Freq: Once | INTRAVENOUS | Status: AC

## 2020-09-20 NOTE — Progress Notes (Signed)
[place]   [phone] [phone]   Clinic Follow up Note   Patient Care Team: Gildardo Pounds, NP as PCP - General (Nurse Practitioner)  Date of Service:  09/20/2020  CHIEF COMPLAINT: f/u of anemia  CURRENT THERAPY:  Continue ferrous sulfate 325 mg oral 3 times a day.  IV Feriheme as needed, last given 01/17/20  ASSESSMENT & PLAN:  Amy Maldonado is a 45 y.o. female with   1. 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 She has only required 2 blood infusions in 2019  Last IV Feraheme 04/05/19. She has required IV Iron about 3 times a year. She responds to IV iron well. -Although her anemia from Menorrhagia is mostly controlled, Hg will periodically drop even on oral and IV Iron. I suspect she has component of Anemia of Chronic disease from her DM.  -She notes only mild fatigue from her anemia. She continues to have monthly periods with Menorrhagia the first 3 days.  -She is clinically doing well. Labs reviewed today, Hg 9.1. Iron panel still pending. She notes she ran out of oral iron and forgot to ask for a refill. Will proceed with IV Feraheme this afternoon.  -Continue Ferrous sulfate TID  -Monitor with lab every 3 months. F/u in 6 months    2. Hypothyroidism -She's been on 25 mg Synthroid. Stable.    3. Diabetes -She is currently on Metformin. She will continue to follow up with her PCP.    4. Cancer screening: Continue age appropriate cancer screenings     Plan -Proceed with IV Feraheme today and the week of 09/30/20 -Lab in 3 and 6 months  -f/u in 6 months -Continue oral iron TID   No problem-specific Assessment & Plan notes found for this encounter.   INTERVAL HISTORY:  Amy Maldonado is here for a follow up of anemia. She was last seen by me on 12/22/19. She presents to the clinic accompanied  by our interpreter Almyra Free. She reports feeling well today. She notes she only feels mild fatigue when she is anemic. She was noted to have low hgb back in 06/2020 when she came for lab work, but no one reached out to her about IV iron.  She reports she continues to have heavy periods. She notes they have performed biopsy and found nothing. She was not recommended oral contraceptives. She notes she also has a different colored discharge (an orange color?) about 10 days after her period. She notes she was recently off a number of her medications due to her not having insurance. She receives financial aid, and there was an issue with this. She is back on these as of early 07/2020.   All other systems were reviewed with the patient and are negative.  MEDICAL HISTORY:  Past Medical History:  Diagnosis Date   Anemia    Diabetes mellitus without complication ([place])    Hypothyroidism 2011    SURGICAL HISTORY: Past Surgical History:  Procedure Laterality Date   CESAREAN SECTION     3 c section   TUBAL LIGATION      I have reviewed the social history and family history with the patient and they are unchanged from previous note.  ALLERGIES:  has No Known Allergies.  MEDICATIONS:  Current Outpatient Medications  Medication Sig Dispense Refill   atorvastatin (LIPITOR) 20 MG tablet  Take 1 tablet (20 mg total) by mouth daily. NEED PASS 90 tablet 3   Blood Glucose Monitoring Suppl (TRUE METRIX METER) w/Device KIT Use as instructed. 1 kit 0   ferrous sulfate 325 (65 FE) MG tablet Take 1 tablet (325 mg total) by mouth 3 (three) times daily with meals. 180 tablet 3   glimepiride (AMARYL) 4 MG tablet Take 1 tablet (4 mg total) by mouth daily before breakfast. NEEDS PASS 90 tablet 1   glucose blood (TRUE METRIX BLOOD GLUCOSE TEST) test strip Use as instructed. Monitor blood glucose levels twice a day by fingerstick. 100 each 12   levothyroxine (SYNTHROID) 25 MCG tablet Take 1 tablet (25 mcg total) by mouth  daily before breakfast. NEEDS PASS 90 tablet 1   lisinopril (ZESTRIL) 2.5 MG tablet Take 1 tablet (2.5 mg total) by mouth daily. NEEDS PASS 90 tablet 1   metFORMIN (GLUCOPHAGE) 500 MG tablet Take 2 tablets (1,000 mg total) by mouth 2 (two) times daily with a meal. NEEDS PASS 360 tablet 1   sitaGLIPtin (JANUVIA) 50 MG tablet Take 1 tablet (50 mg total) by mouth daily. NEEDS PASS 90 tablet 1   TRUEplus Lancets 28G MISC Use as instructed. Monitor blood glucose levels twice a day by fingerstick. 100 each 3   No current facility-administered medications for this visit.    PHYSICAL EXAMINATION: ECOG PERFORMANCE STATUS: 0 - Asymptomatic  Vitals:   09/20/20 1014  BP: 123/70  Pulse: 72  Temp: 98.3 F (36.8 C)  SpO2: 100%   Wt Readings from Last 3 Encounters:  09/20/20 148 lb 1.6 oz (67.2 kg)  07/17/20 147 lb (66.7 kg)  12/22/19 156 lb 8 oz (71 kg)     GENERAL:alert, no distress and comfortable SKIN: skin color normal, no rashes or significant lesions EYES: normal, Conjunctiva are pink and non-injected, sclera clear  NEURO: alert & oriented x 3 with fluent speech  LABORATORY DATA:  I have reviewed the data as listed CBC Latest Ref Rng & Units 09/20/2020 06/19/2020 03/27/2020  WBC 4.0 - 10.5 K/uL 8.2 9.0 8.3  Hemoglobin 12.0 - 15.0 g/dL 9.1(L) 9.5(L) 12.0  Hematocrit 36.0 - 46.0 % 31.2(L) 31.8(L) 37.0  Platelets 150 - 400 K/uL 452(H) 327 308     CMP Latest Ref Rng & Units 06/14/2019 03/09/2019 11/07/2018  Glucose 65 - 99 mg/dL 114(H) 108(H) 229(H)  BUN 6 - 24 mg/dL _0 Creatinine 0.57 - 1.00 mg/dL 0.53(L) 0.52(L) 0.79  Sodium 134 - 144 mmol/L 142 138 136  Potassium 3.5 - 5.2 mmol/L 4.2 4.3 3.9  Chloride 96 - 106 mmol/L 104 103 103  CO2 20 - 29 mmol/L _1 Calcium 8.7 - 10.2 mg/dL 9.4 9.6 9.6  Total Protein 6.0 - 8.5 g/dL - - 6.9  Total Bilirubin 0.0 - 1.2 mg/dL - - <0.2  Alkaline Phos 39 - 117 IU/L - - 97  AST 0 - 40 IU/L - - 28  ALT 0 - 32 IU/L - - 46(H)       RADIOGRAPHIC STUDIES: I have personally reviewed the radiological images as listed and agreed with the findings in the report. No results found.    No orders of the defined types were placed in this encounter.  All questions were answered. The patient knows to call the clinic with any problems, questions or concerns. No barriers to learning was detected. The total time spent in the appointment was 25 minutes.     Truitt Merle, MD  09/20/2020   I, Wilburn Mylar, am acting as scribe for Truitt Merle, MD.   I have reviewed the above documentation for accuracy and completeness, and I agree with the above.

## 2020-09-20 NOTE — Patient Instructions (Signed)
Ferumoxytol Injection Qu es este medicamento? El FERUMOXITOL es un complejo de hierro. El hierro se usa para producir glbulos rojos saludables, que transportan oxgeno y nutrientes por el cuerpo. Este medicamento se usa para tratar la anemia por deficiencia de hierro. Este medicamento puede ser utilizado para otros usos; si tiene alguna pregunta consulte con su proveedor de atencin mdica o con su farmacutico. MARCAS COMUNES: Feraheme Qu le debo informar a mi profesional de la salud antes de tomar este medicamento? Necesita saber si usted presenta alguno de los siguientes problemas o situaciones: anemia no provocada por niveles bajos de hierro niveles altos de hierro en la sangre examen de imgenes por resonancia magntica (IRM) programado una reaccin alrgica o inusual al hierro, otros medicamentos, alimentos, colorantes o conservantes si est embarazada o buscando quedar embarazada si est amamantando a un beb Cmo debo utilizar este medicamento? Este medicamento se administra mediante inyeccin por va intravenosa. Lo administra un profesional de la salud en un hospital o en un entorno clnico. Hable con su pediatra para informarse acerca del uso de este medicamento en nios. Puede requerir atencin especial. Sobredosis: Pngase en contacto inmediatamente con un centro toxicolgico o una sala de urgencia si usted cree que haya tomado demasiado medicamento. ATENCIN: Este medicamento es solo para usted. No comparta este medicamento con nadie. Qu sucede si me olvido de una dosis? Es importante no olvidar ninguna dosis. Informe a su mdico o a su profesional de la salud si no puede asistir a una cita. Qu puede interactuar con este medicamento? Esta medicina puede interactuar con los siguientes medicamentos: otros productos de hierro Puede ser que esta lista no menciona todas las posibles interacciones. Informe a su profesional de la salud de todos los productos a base de hierbas,  medicamentos de venta libre o suplementos nutritivos que est tomando. Si usted fuma, consume bebidas alcohlicas o si utiliza drogas ilegales, indqueselo tambin a su profesional de la salud. Algunas sustancias pueden interactuar con su medicamento. A qu debo estar atento al usar este medicamento? Visite a su mdico o a su profesional de la salud de manera regular. Si los sntomas no comienzan a mejorar o si empeoran, consulte con su mdico o con su profesional de la salud. Tal vez necesita realizarse anlisis de sangre mientras recibe este medicamento. Tal vez necesita seguir una dieta especial. Consulte a su mdico. Los alimentos que contienen hierro incluyen: alimentos integrales o con cereales, frutas secas, frijoles o arvejas, vegetales de hoja verde y carne que proviene de rganos (hgado, rin). Qu efectos secundarios puedo tener al utilizar este medicamento? Efectos secundarios que debe informar a su mdico o a su profesional de la salud tan pronto como sea posible: reacciones alrgicas, como erupcin cutnea, picazn o urticarias, e hinchazn de la cara, los labios o la lengua problemas respiratorios cambios en la presin sangunea sensacin de desmayos o aturdimiento, cadas fiebre o escalofros enrojecimiento, sudoracin o sensacin de calor hinchazn de tobillos o pies Efectos secundarios que generalmente no requieren atencin mdica (infrmelos a su mdico o a su profesional de la salud si persisten o si son molestos): diarrea dolor de cabeza nuseas, vmito dolor estomacal Puede ser que esta lista no menciona todos los posibles efectos secundarios. Comunquese a su mdico por asesoramiento mdico sobre los efectos secundarios. Usted puede informar los efectos secundarios a la FDA por telfono al 1-800-FDA-1088. Dnde debo guardar mi medicina? Este medicamento se administra en hospitales o clnicas y no necesitar guardarlo en su domicilio. ATENCIN: Este folleto es   un resumen. Puede  ser que no cubra toda la posible informacin. Si usted tiene preguntas acerca de esta medicina, consulte con su mdico, su farmacutico o su profesional de la salud.  2022 Elsevier/Gold Standard (2016-04-21 00:00:00)  

## 2020-10-01 ENCOUNTER — Other Ambulatory Visit: Payer: Self-pay

## 2020-10-01 ENCOUNTER — Inpatient Hospital Stay: Payer: Self-pay

## 2020-10-01 VITALS — BP 117/69 | HR 68 | Temp 98.2°F | Resp 18

## 2020-10-01 DIAGNOSIS — D500 Iron deficiency anemia secondary to blood loss (chronic): Secondary | ICD-10-CM

## 2020-10-01 MED ORDER — SODIUM CHLORIDE 0.9 % IV SOLN
510.0000 mg | Freq: Once | INTRAVENOUS | Status: AC
  Administered 2020-10-01: 510 mg via INTRAVENOUS
  Filled 2020-10-01: qty 510

## 2020-10-01 MED ORDER — SODIUM CHLORIDE 0.9 % IV SOLN: Freq: Once | INTRAVENOUS | Status: AC

## 2020-10-01 NOTE — Patient Instructions (Signed)
Ferumoxytol Injection What is this medication? FERUMOXYTOL (FER ue MOX i tol) treats low levels of iron in your body (iron deficiency anemia). Iron is a mineral that plays an important role in making red blood cells, which carry oxygen from your lungs to the rest of your body. This medicine may be used for other purposes; ask your health care provider or pharmacist if you have questions. COMMON BRAND NAME(S): Feraheme What should I tell my care team before I take this medication? They need to know if you have any of these conditions: Anemia not caused by low iron levels High levels of iron in the blood Magnetic resonance imaging (MRI) test scheduled An unusual or allergic reaction to iron, other medications, foods, dyes, or preservatives Pregnant or trying to get pregnant Breast-feeding How should I use this medication? This medication is for injection into a vein. It is given in a hospital or clinic setting. Talk to your care team the use of this medication in children. Special care may be needed. Overdosage: If you think you have taken too much of this medicine contact a poison control center or emergency room at once. NOTE: This medicine is only for you. Do not share this medicine with others. What if I miss a dose? It is important not to miss your dose. Call your care team if you are unable to keep an appointment. What may interact with this medication? Other iron products This list may not describe all possible interactions. Give your health care provider a list of all the medicines, herbs, non-prescription drugs, or dietary supplements you use. Also tell them if you smoke, drink alcohol, or use illegal drugs. Some items may interact with your medicine. What should I watch for while using this medication? Visit your care team regularly. Tell your care team if your symptoms do not start to get better or if they get worse. You may need blood work done while you are taking this  medication. You may need to follow a special diet. Talk to your care team. Foods that contain iron include: whole grains/cereals, dried fruits, beans, or peas, leafy green vegetables, and organ meats (liver, kidney). What side effects may I notice from receiving this medication? Side effects that you should report to your care team as soon as possible: Allergic reactions-skin rash, itching, hives, swelling of the face, lips, tongue, or throat Low blood pressure-dizziness, feeling faint or lightheaded, blurry vision Shortness of breath Side effects that usually do not require medical attention (report to your care team if they continue or are bothersome): Flushing Headache Joint pain Muscle pain Nausea Pain, redness, or irritation at injection site This list may not describe all possible side effects. Call your doctor for medical advice about side effects. You may report side effects to FDA at 1-800-FDA-1088. Where should I keep my medication? This medication is given in a hospital or clinic and will not be stored at home. NOTE: This sheet is a summary. It may not cover all possible information. If you have questions about this medicine, talk to your doctor, pharmacist, or health care provider.  2022 Elsevier/Gold Standard (2020-05-17 15:35:12)  

## 2020-10-03 NOTE — Progress Notes (Signed)
..  Patient is receiving Assistance Medication - Supplied Externally. Medication: Feraheme (ferumoxytol) Manufacture: [employer] Approval Dates: Approved from 10/02/2020 until 10/02/2021. ID: [id number] Reason: Self Pay  First DOS: 09/20/2020. Marland KitchenJuan Quam, CPhT IV Drug Replacement Specialist [place] Phone: [phone]

## 2020-10-16 ENCOUNTER — Ambulatory Visit: Payer: Self-pay | Attending: Nurse Practitioner | Admitting: Nurse Practitioner

## 2020-10-16 ENCOUNTER — Encounter: Payer: Self-pay | Admitting: Hematology

## 2020-10-16 ENCOUNTER — Other Ambulatory Visit: Payer: Self-pay

## 2020-10-16 ENCOUNTER — Encounter: Payer: Self-pay | Admitting: Nurse Practitioner

## 2020-10-16 ENCOUNTER — Other Ambulatory Visit: Payer: Self-pay | Admitting: Nurse Practitioner

## 2020-10-16 VITALS — BP 120/78 | HR 75 | Ht 60.0 in | Wt 149.2 lb

## 2020-10-16 DIAGNOSIS — Z23 Encounter for immunization: Secondary | ICD-10-CM

## 2020-10-16 DIAGNOSIS — E119 Type 2 diabetes mellitus without complications: Secondary | ICD-10-CM

## 2020-10-16 DIAGNOSIS — E039 Hypothyroidism, unspecified: Secondary | ICD-10-CM

## 2020-10-16 DIAGNOSIS — E785 Hyperlipidemia, unspecified: Secondary | ICD-10-CM

## 2020-10-16 DIAGNOSIS — Z1159 Encounter for screening for other viral diseases: Secondary | ICD-10-CM

## 2020-10-16 DIAGNOSIS — D509 Iron deficiency anemia, unspecified: Secondary | ICD-10-CM

## 2020-10-16 DIAGNOSIS — Z1231 Encounter for screening mammogram for malignant neoplasm of breast: Secondary | ICD-10-CM

## 2020-10-16 LAB — POCT GLYCOSYLATED HEMOGLOBIN (HGB A1C): Hemoglobin A1C: 9.4 % — AB (ref 4.0–5.6)

## 2020-10-16 LAB — GLUCOSE, POCT (MANUAL RESULT ENTRY): POC Glucose: 231 mg/dl — AB (ref 70–99)

## 2020-10-16 MED ORDER — LISINOPRIL 2.5 MG PO TABS
2.5000 mg | ORAL_TABLET | Freq: Every day | ORAL | 1 refills | Status: AC
  Filled 2020-10-16: qty 30, 30d supply, fill #0
  Filled 2020-11-27: qty 30, 30d supply, fill #1
  Filled 2021-01-01: qty 30, 30d supply, fill #2

## 2020-10-16 MED ORDER — GLIMEPIRIDE 4 MG PO TABS
4.0000 mg | ORAL_TABLET | Freq: Every day | ORAL | 1 refills | Status: AC
  Filled 2020-10-16: qty 30, 30d supply, fill #0
  Filled 2020-11-27: qty 30, 30d supply, fill #1
  Filled 2021-01-01: qty 30, 30d supply, fill #2

## 2020-10-16 MED ORDER — SITAGLIPTIN PHOSPHATE 50 MG PO TABS
50.0000 mg | ORAL_TABLET | Freq: Every day | ORAL | 1 refills | Status: AC
  Filled 2020-10-16: qty 30, 30d supply, fill #0
  Filled 2020-11-27: qty 30, 30d supply, fill #1

## 2020-10-16 MED ORDER — TRUEPLUS LANCETS 28G MISC
3 refills | Status: AC
  Filled 2020-10-16: qty 100, 50d supply, fill #0

## 2020-10-16 MED ORDER — TRUE METRIX BLOOD GLUCOSE TEST VI STRP
ORAL_STRIP | 12 refills | Status: AC
  Filled 2020-10-16: qty 100, 50d supply, fill #0

## 2020-10-16 MED ORDER — FERROUS SULFATE 325 (65 FE) MG PO TABS
325.0000 mg | ORAL_TABLET | Freq: Three times a day (TID) | ORAL | 3 refills | Status: AC
  Filled 2020-10-16: qty 90, 30d supply, fill #0

## 2020-10-16 MED ORDER — LEVOTHYROXINE SODIUM 25 MCG PO TABS
25.0000 ug | ORAL_TABLET | Freq: Every day | ORAL | 1 refills | Status: AC
  Filled 2020-10-16: qty 30, 30d supply, fill #0
  Filled 2020-11-27: qty 30, 30d supply, fill #1
  Filled 2021-01-01: qty 30, 30d supply, fill #2

## 2020-10-16 MED ORDER — ATORVASTATIN CALCIUM 20 MG PO TABS
20.0000 mg | ORAL_TABLET | Freq: Every day | ORAL | 3 refills | Status: AC
  Filled 2020-10-16: qty 30, 30d supply, fill #0
  Filled 2021-01-01: qty 30, 30d supply, fill #1

## 2020-10-16 MED ORDER — METFORMIN HCL 500 MG PO TABS
1000.0000 mg | ORAL_TABLET | Freq: Two times a day (BID) | ORAL | 1 refills | Status: AC
  Filled 2020-10-16: qty 120, 30d supply, fill #0
  Filled 2021-01-01: qty 120, 30d supply, fill #1

## 2020-10-16 NOTE — Progress Notes (Signed)
Assessment & Plan:  Amy Maldonado was seen today for diabetes.  Diagnoses and all orders for this visit:  Controlled type 2 diabetes mellitus without complication, without long-term current use of insulin (HCC) -     POCT glucose (manual entry) -     POCT glycosylated hemoglobin (Hb A1C) Continue blood sugar control as discussed in office today, low carbohydrate diet, and regular physical exercise as tolerated, 150 minutes per week (30 min each day, 5 days per week, or 50 min 3 days per week). Keep blood sugar logs with fasting goal of 90-130 mg/dl, post prandial (after you eat) less than 180.  For Hypoglycemia: BS <60 and Hyperglycemia BS >400; contact the clinic ASAP. Annual eye exams and foot exams are recommended.   Dyslipidemia, goal LDL below 70 INSTRUCTIONS: Work on a low fat, heart healthy diet and participate in regular aerobic exercise program by working out at least 150 minutes per week; 5 days a week-30 minutes per day. Avoid red meat/beef/steak,  fried foods. junk foods, sodas, sugary drinks, unhealthy snacking, alcohol and smoking.  Drink at least 80 oz of water per day and monitor your carbohydrate intake daily.    Hypothyroidism, unspecified type Continue levothyroxine as prescribed.   Breast cancer screening by mammogram -     MS DIGITAL SCREENING TOMO BILATERAL; Future Refer to be [facility]  Patient has been counseled on age-appropriate routine health concerns for screening and prevention. These are reviewed and up-to-date. Referrals have been placed accordingly. Immunizations are up-to-date or declined.    Subjective:   Chief Complaint  Patient presents with   Diabetes   Diabetes Pertinent negatives for hypoglycemia include no dizziness, headaches or seizures. Pertinent negatives for diabetes include no blurred vision, no chest pain and no weight loss.  Amy Maldonado 45 y.o. female presents to office today for follow up to Hypothyroidism and DM VRI was used to  communicate directly with patient for the entire encounter including providing detailed patient instructions.    Missed menstrual cycle last month. Had one around August 5th and now currently on her menstrual cycle.  She does have a history of tubal ligation..  Today.  The as   DM 2 Poorly controlled. Taking amaryl 4 mg daily, metformin 1000 mg twice daily and Januvia 50 mg daily.  She is on renal dose ACE and statin. She wants to try 3 months and then if her A1c is not at goal of less than 7 she is amenable to injectable or insulin.  LDL not at goal with atorvastatin 20 mg daily.  Blood pressure is well controlled today. Denies chest pain, shortness of breath, palpitations, lightheadedness, dizziness, headaches or BLE edema.   Lab Results  Component Value Date   HGBA1C 9.4 (A) 10/16/2020    Lab Results  Component Value Date   HGBA1C 11.9 (A) 07/17/2020     Lab Results  Component Value Date   LDLCALC 84 06/14/2019    BP Readings from Last 3 Encounters:  10/16/20 120/78  10/01/20 117/69  09/20/20 (!) 106/57     Hypothyroidism Taking synthroid as prescribed.  We will repeat thyroid level today.  She will continue on levothyroxine 25 mg daily. Lab Results  Component Value Date   TSH 4.320 04/19/2019    Review of Systems  Constitutional:  Negative for fever, malaise/fatigue and weight loss.  HENT: Negative.  Negative for nosebleeds.   Eyes: Negative.  Negative for blurred vision, double vision and photophobia.  Respiratory: Negative.  Negative for cough and shortness of breath.   Cardiovascular: Negative.  Negative for chest pain, palpitations and leg swelling.  Gastrointestinal: Negative.  Negative for heartburn, nausea and vomiting.  Musculoskeletal: Negative.  Negative for myalgias.  Neurological: Negative.  Negative for dizziness, focal weakness, seizures and headaches.  Psychiatric/Behavioral: Negative.  Negative for suicidal ideas.    Past Medical History:  Diagnosis  Date   Anemia    Diabetes mellitus without complication ([facility])    Hypothyroidism 2011    Past Surgical History:  Procedure Laterality Date   CESAREAN SECTION     3 c section   TUBAL LIGATION      Family History  Problem Relation Age of Onset   Hypertension Mother    Diabetes Father    Hypertension Father    Cancer Father        prostate cancer    Social History Reviewed with no changes to be made today.   Outpatient Medications Prior to Visit  Medication Sig Dispense Refill   atorvastatin (LIPITOR) 20 MG tablet Take 1 tablet (20 mg total) by mouth daily. NEED PASS 90 tablet 3   Blood Glucose Monitoring Suppl (TRUE METRIX METER) w/Device KIT Use as instructed. 1 kit 0   ferrous sulfate 325 (65 FE) MG tablet Take 1 tablet (325 mg total) by mouth 3 (three) times daily with meals. 180 tablet 3   glucose blood (TRUE METRIX BLOOD GLUCOSE TEST) test strip Use as instructed. Monitor blood glucose levels twice a day by fingerstick. 100 each 12   lisinopril (ZESTRIL) 2.5 MG tablet Take 1 tablet (2.5 mg total) by mouth daily. NEEDS PASS 90 tablet 1   TRUEplus Lancets 28G MISC Use as instructed. Monitor blood glucose levels twice a day by fingerstick. 100 each 3   glimepiride (AMARYL) 4 MG tablet Take 1 tablet (4 mg total) by mouth daily before breakfast. NEEDS PASS 90 tablet 1   levothyroxine (SYNTHROID) 25 MCG tablet Take 1 tablet (25 mcg total) by mouth daily before breakfast. NEEDS PASS 90 tablet 1   metFORMIN (GLUCOPHAGE) 500 MG tablet Take 2 tablets (1,000 mg total) by mouth 2 (two) times daily with a meal. NEEDS PASS 360 tablet 1   sitaGLIPtin (JANUVIA) 50 MG tablet Take 1 tablet (50 mg total) by mouth daily. NEEDS PASS 90 tablet 1   No facility-administered medications prior to visit.    No Known Allergies     Objective:    BP 120/78   Pulse 75   Ht 5' (1.524 m)   Wt 149 lb 4 oz (67.7 kg)   LMP 10/14/2020 (Exact Date)   SpO2 100%   BMI 29.15 kg/m  Wt Readings from Last  3 Encounters:  10/16/20 149 lb 4 oz (67.7 kg)  09/20/20 148 lb 1.6 oz (67.2 kg)  07/17/20 147 lb (66.7 kg)    Physical Exam Vitals and nursing note reviewed.  Constitutional:      Appearance: She is well-developed.  HENT:     Head: Normocephalic and atraumatic.  Cardiovascular:     Rate and Rhythm: Normal rate and regular rhythm.     Heart sounds: Normal heart sounds. No murmur heard.   No friction rub. No gallop.  Pulmonary:     Effort: Pulmonary effort is normal. No tachypnea or respiratory distress.     Breath sounds: Normal breath sounds. No decreased breath sounds, wheezing, rhonchi or rales.  Chest:     Chest wall: No tenderness.  Abdominal:  General: Bowel sounds are normal.     Palpations: Abdomen is soft.  Musculoskeletal:        General: Normal range of motion.     Cervical back: Normal range of motion.  Skin:    General: Skin is warm and dry.  Neurological:     Mental Status: She is alert and oriented to person, place, and time.     Coordination: Coordination normal.  Psychiatric:        Behavior: Behavior normal. Behavior is cooperative.        Thought Content: Thought content normal.        Judgment: Judgment normal.         Patient has been counseled extensively about nutrition and exercise as well as the importance of adherence with medications and regular follow-up. The patient was given clear instructions to go to ER or return to medical center if symptoms don't improve, worsen or new problems develop. The patient verbalized understanding.   Follow-up: Return for PAP SMEAR.   Gildardo Pounds, FNP-BC [facility] and [place], [place]   10/16/2020, 8:58 AM

## 2020-10-17 ENCOUNTER — Telehealth: Payer: Self-pay

## 2020-10-17 LAB — CMP14+EGFR
ALT: 52 IU/L — ABNORMAL HIGH (ref 0–32)
AST: 26 IU/L (ref 0–40)
Albumin/Globulin Ratio: 1.4 (ref 1.2–2.2)
Albumin: 4.2 g/dL (ref 3.8–4.8)
Alkaline Phosphatase: 100 IU/L (ref 44–121)
BUN/Creatinine Ratio: 20 (ref 9–23)
BUN: 11 mg/dL (ref 6–24)
Bilirubin Total: <0.2 mg/dL (ref 0.0–1.2)
CO2: 20 mmol/L (ref 20–29)
Calcium: 9.1 mg/dL (ref 8.7–10.2)
Chloride: 100 mmol/L (ref 96–106)
Creatinine, Ser: 0.55 mg/dL — ABNORMAL LOW (ref 0.57–1.00)
Globulin, Total: 2.9 g/dL (ref 1.5–4.5)
Glucose: 254 mg/dL — ABNORMAL HIGH (ref 70–99)
Potassium: 4.6 mmol/L (ref 3.5–5.2)
Sodium: 136 mmol/L (ref 134–144)
Total Protein: 7.1 g/dL (ref 6.0–8.5)
eGFR: 116 mL/min/{1.73_m2} (ref >59)

## 2020-10-17 LAB — HCV INTERPRETATION

## 2020-10-17 LAB — LIPID PANEL
Chol/HDL Ratio: 4.3 ratio (ref 0.0–4.4)
Cholesterol, Total: 159 mg/dL (ref 100–199)
HDL: 37 mg/dL — ABNORMAL LOW (ref >39)
LDL Chol Calc (NIH): 103 mg/dL — ABNORMAL HIGH (ref 0–99)
Triglycerides: 104 mg/dL (ref 0–149)
VLDL Cholesterol Cal: 19 mg/dL (ref 5–40)

## 2020-10-17 LAB — THYROID PANEL WITH TSH
Free Thyroxine Index: 1.7 (ref 1.2–4.9)
T3 Uptake Ratio: 26 % (ref 24–39)
T4, Total: 6.7 ug/dL (ref 4.5–12.0)
TSH: 5.72 u[IU]/mL — ABNORMAL HIGH (ref 0.450–4.500)

## 2020-10-17 LAB — HCV AB W REFLEX TO QUANT PCR: HCV Ab: <0.1 s/co ratio (ref 0.0–0.9)

## 2020-10-17 NOTE — Telephone Encounter (Signed)
-----   Message from Gildardo Pounds, NP sent at 10/17/2020 10:50 AM EDT ----- Thyroid level slightly elevated. Continue on current dose of medication and try to take at the same time every day. Hep c negative. Kidney, liver function and electrolytes are essentially normal.  There are a few variations that do not require additional work up at this time. Cholesterol levels slightly elevated. Continue cholesterol medication daily as prescribed.

## 2020-11-14 ENCOUNTER — Other Ambulatory Visit: Payer: Self-pay

## 2020-11-27 ENCOUNTER — Other Ambulatory Visit (HOSPITAL_COMMUNITY)
Admission: RE | Admit: 2020-11-27 | Discharge: 2020-11-27 | Disposition: A | Discharge status: Home / Self Care | Payer: No Typology Code available for payment source | Source: Ambulatory Visit | Attending: Nurse Practitioner | Admitting: Nurse Practitioner

## 2020-11-27 ENCOUNTER — Other Ambulatory Visit: Payer: Self-pay

## 2020-11-27 ENCOUNTER — Encounter: Payer: Self-pay | Admitting: Hematology

## 2020-11-27 ENCOUNTER — Encounter: Payer: Self-pay | Admitting: Nurse Practitioner

## 2020-11-27 ENCOUNTER — Ambulatory Visit: Payer: Self-pay | Attending: Nurse Practitioner | Admitting: Nurse Practitioner

## 2020-11-27 VITALS — BP 121/74 | HR 86 | Ht 60.0 in | Wt 153.4 lb

## 2020-11-27 DIAGNOSIS — Z124 Encounter for screening for malignant neoplasm of cervix: Secondary | ICD-10-CM | POA: Insufficient documentation

## 2020-11-27 NOTE — Progress Notes (Signed)
Assessment & Plan:  Amy Maldonado was seen today for gynecologic exam.  Diagnoses and all orders for this visit:  Encounter for Papanicolaou smear for cervical cancer screening -     Cytology - PAP -     Cervicovaginal ancillary only   Patient has been counseled on age-appropriate routine health concerns for screening and prevention. These are reviewed and up-to-date. Referrals have been placed accordingly. Immunizations are up-to-date or declined.    Subjective:   Chief Complaint  Patient presents with   Gynecologic Exam    Amy Maldonado 45 y.o. female presents to office today for PAP smear.     Review of Systems  Constitutional: Negative.  Negative for chills, fever, malaise/fatigue and weight loss.  Respiratory: Negative.  Negative for cough, shortness of breath and wheezing.   Cardiovascular: Negative.  Negative for chest pain, orthopnea and leg swelling.  Gastrointestinal:  Negative for abdominal pain.  Genitourinary: Negative.  Negative for flank pain.  Skin: Negative.  Negative for rash.  Psychiatric/Behavioral:  Negative for suicidal ideas.    Past Medical History:  Diagnosis Date   Anemia    Diabetes mellitus without complication (Amy Maldonado)    Hypothyroidism 2011    Past Surgical History:  Procedure Laterality Date   CESAREAN SECTION     3 c section   TUBAL LIGATION      Family History  Problem Relation Age of Onset   Hypertension Mother    Diabetes Father    Hypertension Father    Cancer Father        prostate cancer    Social History Reviewed with no changes to be made today.   Outpatient Medications Prior to Visit  Medication Sig Dispense Refill   atorvastatin (LIPITOR) 20 MG tablet Take 1 tablet (20 mg total) by mouth daily. 90 tablet 3   Blood Glucose Monitoring Suppl (TRUE METRIX METER) w/Device KIT Use as instructed. 1 kit 0   ferrous sulfate 325 (65 FE) MG tablet Take 1 tablet (325 mg total) by mouth 3 (three) times daily with meals. 180  tablet 3   glimepiride (AMARYL) 4 MG tablet Take 1 tablet (4 mg total) by mouth daily before breakfast. 90 tablet 1   glucose blood (TRUE METRIX BLOOD GLUCOSE TEST) test strip Use as instructed. Monitor blood glucose levels twice a day by fingerstick. 100 each 12   levothyroxine (SYNTHROID) 25 MCG tablet Take 1 tablet (25 mcg total) by mouth daily before breakfast. 90 tablet 1   lisinopril (ZESTRIL) 2.5 MG tablet Take 1 tablet (2.5 mg total) by mouth daily. 90 tablet 1   metFORMIN (GLUCOPHAGE) 500 MG tablet Take 2 tablets (1,000 mg total) by mouth 2 (two) times daily with a meal. 360 tablet 1   sitaGLIPtin (JANUVIA) 50 MG tablet Take 1 tablet (50 mg total) by mouth daily. 90 tablet 1   TRUEplus Lancets 28G MISC Use as instructed. Monitor blood glucose levels twice a day by fingerstick. 100 each 3   No facility-administered medications prior to visit.    No Known Allergies     Objective:    BP 121/74   Pulse 86   Ht 5' (1.524 m)   Wt 153 lb 6 oz (69.6 kg)   LMP 11/10/2020   SpO2 98%   BMI 29.95 kg/m  Wt Readings from Last 3 Encounters:  11/27/20 153 lb 6 oz (69.6 kg)  10/16/20 149 lb 4 oz (67.7 kg)  09/20/20 148 lb 1.6 oz (67.2 kg)  Physical Exam Constitutional:      Appearance: She is well-developed.  HENT:     Head: Normocephalic.  Cardiovascular:     Rate and Rhythm: Normal rate and regular rhythm.     Heart sounds: Normal heart sounds.  Pulmonary:     Effort: Pulmonary effort is normal.     Breath sounds: Normal breath sounds.  Abdominal:     General: Bowel sounds are normal.     Palpations: Abdomen is soft.     Hernia: There is no hernia in the left inguinal area.  Genitourinary:    Labia:        Right: No rash, tenderness, lesion or injury.        Left: No rash, tenderness, lesion or injury.      Vagina: Normal. No signs of injury and foreign body. No vaginal discharge, erythema, tenderness or bleeding.     Cervix: No cervical motion tenderness or friability.      Uterus: Not deviated and not enlarged.      Adnexa:        Right: No mass, tenderness or fullness.         Left: No mass, tenderness or fullness.       Rectum: Normal. No external hemorrhoid.  Lymphadenopathy:     Lower Body: No right inguinal adenopathy. No left inguinal adenopathy.  Skin:    General: Skin is warm and dry.  Neurological:     Mental Status: She is alert and oriented to person, place, and time.  Psychiatric:        Behavior: Behavior normal.        Thought Content: Thought content normal.        Judgment: Judgment normal.         Patient has been counseled extensively about nutrition and exercise as well as the importance of adherence with medications and regular follow-up. The patient was given clear instructions to go to ER or return to medical center if symptoms don't improve, worsen or new problems develop. The patient verbalized understanding.   Follow-up: Return if symptoms worsen or fail to improve.   Gildardo Pounds, FNP-BC [facility] and [place], [place]   11/27/2020, 3:00 PM

## 2020-11-28 LAB — CERVICOVAGINAL ANCILLARY ONLY
Bacterial Vaginitis (gardnerella): NEGATIVE
Candida Glabrata: POSITIVE — AB
Candida Vaginitis: NEGATIVE
Chlamydia: NEGATIVE
Comment: NEGATIVE
Comment: NEGATIVE
Comment: NEGATIVE
Comment: NEGATIVE
Comment: NEGATIVE
Comment: NORMAL
Neisseria Gonorrhea: NEGATIVE
Trichomonas: NEGATIVE

## 2020-11-29 ENCOUNTER — Other Ambulatory Visit: Payer: Self-pay | Admitting: Nurse Practitioner

## 2020-11-29 LAB — CYTOLOGY - PAP
Comment: NEGATIVE
Diagnosis: NEGATIVE
High risk HPV: NEGATIVE

## 2020-11-29 MED ORDER — FLUCONAZOLE 150 MG PO TABS: 150.0000 mg | ORAL_TABLET | Freq: Once | ORAL | 0 refills | Status: AC

## 2020-12-03 ENCOUNTER — Telehealth: Payer: Self-pay

## 2020-12-03 NOTE — Telephone Encounter (Signed)
Left message to return call to our office.  With the help of interpretor Ellison Hughs  [phone]

## 2020-12-03 NOTE — Telephone Encounter (Signed)
-----   Message from Amy Pounds, NP sent at 11/29/2020  7:47 PM EST ----- Normal PAP smear. Repeat in 3 years. Vaginal swab positive for yeast. Will send diflucan to pharmacy

## 2020-12-03 NOTE — Telephone Encounter (Signed)
-----   Message from Gildardo Pounds, NP sent at 11/29/2020  7:47 PM EST ----- Normal PAP smear. Repeat in 3 years. Vaginal swab positive for yeast. Will send diflucan to pharmacy

## 2020-12-18 ENCOUNTER — Inpatient Hospital Stay: Payer: No Typology Code available for payment source | Attending: Hematology

## 2020-12-18 ENCOUNTER — Other Ambulatory Visit: Payer: Self-pay

## 2020-12-18 DIAGNOSIS — N920 Excessive and frequent menstruation with regular cycle: Secondary | ICD-10-CM | POA: Insufficient documentation

## 2020-12-18 DIAGNOSIS — D500 Iron deficiency anemia secondary to blood loss (chronic): Secondary | ICD-10-CM

## 2020-12-18 LAB — CBC WITH DIFFERENTIAL (CANCER CENTER ONLY)
Abs Immature Granulocytes: 0.04 10*3/uL (ref 0.00–0.07)
Basophils Absolute: 0 10*3/uL (ref 0.0–0.1)
Basophils Relative: 0 %
Eosinophils Absolute: 0.1 10*3/uL (ref 0.0–0.5)
Eosinophils Relative: 1 %
HCT: 35.3 % — ABNORMAL LOW (ref 36.0–46.0)
Hemoglobin: 12 g/dL (ref 12.0–15.0)
Immature Granulocytes: 1 %
Lymphocytes Relative: 24 %
Lymphs Abs: 1.9 10*3/uL (ref 0.7–4.0)
MCH: 30.5 pg (ref 26.0–34.0)
MCHC: 34 g/dL (ref 30.0–36.0)
MCV: 89.8 fL (ref 80.0–100.0)
Monocytes Absolute: 0.7 10*3/uL (ref 0.1–1.0)
Monocytes Relative: 9 %
Neutro Abs: 5.1 10*3/uL (ref 1.7–7.7)
Neutrophils Relative %: 65 %
Platelet Count: 358 10*3/uL (ref 150–400)
RBC: 3.93 MIL/uL (ref 3.87–5.11)
RDW: 17.2 % — ABNORMAL HIGH (ref 11.5–15.5)
WBC Count: 7.8 10*3/uL (ref 4.0–10.5)
nRBC: 0 % (ref 0.0–0.2)

## 2020-12-18 LAB — IRON AND TIBC
Iron: 35 ug/dL — ABNORMAL LOW (ref 41–142)
Saturation Ratios: 10 % — ABNORMAL LOW (ref 21–57)
TIBC: 344 ug/dL (ref 236–444)
UIBC: 309 ug/dL (ref 120–384)

## 2020-12-18 LAB — FERRITIN: Ferritin: 57 ng/mL (ref 11–307)

## 2020-12-23 ENCOUNTER — Other Ambulatory Visit: Payer: Self-pay

## 2020-12-23 ENCOUNTER — Telehealth: Payer: Self-pay

## 2020-12-23 NOTE — Telephone Encounter (Signed)
LVM regarding pt's iron lab results.  Informed pt that Dr. Burr Medico has reviewed the iron labs and the pt's iron in WNL; therefore, the pt's anemia has resolved.  Dr Burr Medico does not recommend any more IV iron treatments at this time.  Instructed pt to please contact Dr. Ernestina Penna office should the pt have further questions or concerns.

## 2020-12-25 ENCOUNTER — Other Ambulatory Visit: Payer: Self-pay | Admitting: Obstetrics and Gynecology

## 2020-12-25 DIAGNOSIS — Z1231 Encounter for screening mammogram for malignant neoplasm of breast: Secondary | ICD-10-CM

## 2020-12-26 ENCOUNTER — Telehealth: Payer: Self-pay | Admitting: Nurse Practitioner

## 2020-12-26 NOTE — Telephone Encounter (Signed)
Copied from [place] [id number]. Topic: General - Other >> Dec 25, 2020  2:03 PM Bayard Beaver wrote: Reason for [id number] trying to schedule appt with Clifton James

## 2020-12-26 NOTE — Telephone Encounter (Signed)
I called back the Pt, schedule a financial appt 01/01/21

## 2020-12-27 ENCOUNTER — Other Ambulatory Visit: Payer: Self-pay

## 2021-01-01 ENCOUNTER — Other Ambulatory Visit: Payer: Self-pay

## 2021-01-01 ENCOUNTER — Ambulatory Visit: Payer: No Typology Code available for payment source | Attending: Nurse Practitioner

## 2021-01-01 ENCOUNTER — Encounter: Payer: Self-pay | Admitting: Hematology

## 2021-01-15 ENCOUNTER — Ambulatory Visit: Payer: No Typology Code available for payment source | Admitting: Nurse Practitioner

## 2021-01-30 ENCOUNTER — Telehealth: Payer: Self-pay | Admitting: Nurse Practitioner

## 2021-01-30 NOTE — Telephone Encounter (Signed)
Copied from [place] [id number]. Topic: General - Other >> Jan 30, 2021  3:27 PM Leward Quan A wrote: Reason for CRM: Patient called in needing to speak to Clifton James can be reached at Ph# [phone]

## 2021-01-30 NOTE — Telephone Encounter (Signed)
Patient requesting to speak to [facility].

## 2021-01-31 ENCOUNTER — Telehealth: Payer: Self-pay | Admitting: Nurse Practitioner

## 2021-01-31 NOTE — Telephone Encounter (Signed)
I return Pt call, she request a copy of her [place] letter, sending by mail today

## 2021-02-13 ENCOUNTER — Ambulatory Visit: Payer: Self-pay | Admitting: *Deleted

## 2021-02-13 ENCOUNTER — Ambulatory Visit
Admission: RE | Admit: 2021-02-13 | Discharge: 2021-02-13 | Disposition: A | Discharge status: Home / Self Care | Payer: No Typology Code available for payment source | Source: Ambulatory Visit | Attending: Obstetrics and Gynecology | Admitting: Obstetrics and Gynecology

## 2021-02-13 ENCOUNTER — Encounter: Payer: Self-pay | Admitting: Hematology

## 2021-02-13 ENCOUNTER — Other Ambulatory Visit: Payer: Self-pay

## 2021-02-13 VITALS — BP 118/76 | Wt 150.9 lb

## 2021-02-13 DIAGNOSIS — Z1231 Encounter for screening mammogram for malignant neoplasm of breast: Secondary | ICD-10-CM

## 2021-02-13 DIAGNOSIS — Z1211 Encounter for screening for malignant neoplasm of colon: Secondary | ICD-10-CM

## 2021-02-13 DIAGNOSIS — Z1239 Encounter for other screening for malignant neoplasm of breast: Secondary | ICD-10-CM

## 2021-02-13 NOTE — Patient Instructions (Signed)
Explained breast self awareness with Amy Maldonado. Patient did not need a Pap smear today due to last Pap smear and HPV typing was 11/27/2020. Let her know [facility] will cover Pap smears and HPV typing every 5 years unless has a history of abnormal Pap smears. Referred patient to the [place] for a screening mammogram on mobile unit. Appointment scheduled Thursday, February 13, 2021 at 1030. Patient aware of appointment and will be there. Let patient know the [facility] will follow up with her within the next couple weeks with results of mammogram by letter or phone. Amy Maldonado verbalized understanding.  Amy Maldonado, Arvil Chaco, RN 9:47 AM

## 2021-02-13 NOTE — Progress Notes (Signed)
[name] is a 46 y.o. female who presents to [facility] today with no complaints.    Pap Smear: Pap smear not completed today. Last Pap smear was 11/27/2020 at [facility] and was normal with negative HPV. Per patient has no history of an abnormal Pap smear. Last Pap smear result is available in Epic.   Physical exam: Breasts Breasts symmetrical. No skin abnormalities bilateral breasts. No nipple retraction bilateral breasts. No nipple discharge bilateral breasts. No lymphadenopathy. No lumps palpated bilateral breasts. No complaints of pain or tenderness on exam.     MM DIAG BREAST TOMO UNI RIGHT  Result Date: 12/20/2017 CLINICAL DATA:  Screening recall for a right breast asymmetry. EXAM: DIGITAL DIAGNOSTIC RIGHT MAMMOGRAM WITH CAD AND TOMO ULTRASOUND RIGHT BREAST COMPARISON:  Baseline screening mammogram 12/07/2017 ACR Breast Density Category c: The breast tissue is heterogeneously dense, which may obscure small masses. FINDINGS: There is no persistent asymmetry seen on the spot compression tomosynthesis images of the lateral right breast. Mammographic images were processed with CAD. On physical exam, no suspicious palpable masses are identified in the lateral aspect of the right breast. Ultrasound of the lateral and upper outer aspect of the right breast demonstrates normal fibroglandular tissue. No masses or suspicious areas of shadowing are identified. IMPRESSION: There are no persistent mammographic or targeted sonographic abnormalities in the lateral right breast. RECOMMENDATION: Screening mammogram in one year.(Code:SM-B-01Y) I have discussed the findings and recommendations with the patient. Results were also provided in writing at the conclusion of the visit. If applicable, a reminder letter will be sent to the patient regarding the next appointment. BI-RADS CATEGORY  1: Negative. Electronically Signed   By: Ammie Ferrier M.D.   On:  12/20/2017 16:19   MS DIGITAL SCREENING TOMO BILATERAL  Result Date: 10/04/2019 CLINICAL DATA:  Screening. EXAM: DIGITAL SCREENING BILATERAL MAMMOGRAM WITH TOMO AND CAD COMPARISON:  Previous exam(s). ACR Breast Density Category c: The breast tissue is heterogeneously dense, which may obscure small masses. FINDINGS: There are no findings suspicious for malignancy. Images were processed with CAD. IMPRESSION: No mammographic evidence of malignancy. A result letter of this screening mammogram will be mailed directly to the patient. RECOMMENDATION: Screening mammogram in one year. (Code:SM-B-01Y) BI-RADS CATEGORY  1: Negative. Electronically Signed   By: Nolon Nations M.D.   On: 10/04/2019 08:28   MS DIGITAL SCREENING TOMO BILATERAL  Result Date: 12/08/2017 CLINICAL DATA:  Screening. EXAM: DIGITAL SCREENING BILATERAL MAMMOGRAM WITH TOMO AND CAD COMPARISON:  None. ACR Breast Density Category c: The breast tissue is heterogeneously dense, which may obscure small masses. FINDINGS: In the right breast, a possible asymmetry warrants further evaluation. This possible asymmetry is seen within the upper RIGHT breast, at far posterior depth, MLO view only. In the left breast, no findings suspicious for malignancy. Images were processed with CAD. IMPRESSION: Further evaluation is suggested for possible asymmetry in the right breast. RECOMMENDATION: Diagnostic mammogram and possibly ultrasound of the right breast. (Code:FI-R-69M) The patient will be contacted regarding the findings, and additional imaging will be scheduled. BI-RADS CATEGORY  0: Incomplete. Need additional imaging evaluation and/or prior mammograms for comparison. Electronically Signed   By: Franki Cabot M.D.   On: 12/08/2017 15:30     Pelvic/Bimanual Pap is not indicated today per BCCCP guidelines.   Smoking History: Patient has never smoked.   Patient Navigation: Patient education provided. Access to services provided for patient through  [facility] program. Spanish interpreter Amy Maldonado from  [facility] provided. Transportation provided by uber/lyft to [employer] appointment today.  Colorectal Cancer Screening: Per patient has never had colonoscopy completed. FIT Test given to patient to complete. No complaints today.    Breast and Cervical Cancer Risk Assessment: Patient does not have family history of breast cancer, known genetic mutations, or radiation treatment to the chest before age 33. Patient does not have history of cervical dysplasia, immunocompromised, or DES exposure in-utero.  Risk Assessment     Risk Scores       02/13/2021 09/26/2019   Last edited by: Demetrius Revel, LPN [place], Sherie Mamie Nick, LPN   5-year risk: 0.5 % 0.4 %   Lifetime risk: 6.1 % 6.2 %            A: BCCCP exam without pap smear No complaints.  P: Referred patient to the [place] for a screening mammogram on mobile unit. Appointment scheduled Thursday, February 13, 2021 at 1030.  Loletta Parish, RN 02/13/2021 9:46 AM

## 2021-02-17 ENCOUNTER — Other Ambulatory Visit: Payer: Self-pay | Admitting: Obstetrics and Gynecology

## 2021-02-17 DIAGNOSIS — R928 Other abnormal and inconclusive findings on diagnostic imaging of breast: Secondary | ICD-10-CM

## 2021-03-03 ENCOUNTER — Other Ambulatory Visit: Payer: Self-pay

## 2021-03-06 ENCOUNTER — Ambulatory Visit
Admission: RE | Admit: 2021-03-06 | Discharge: 2021-03-06 | Disposition: A | Discharge status: Home / Self Care | Payer: No Typology Code available for payment source | Source: Ambulatory Visit | Attending: Obstetrics and Gynecology | Admitting: Obstetrics and Gynecology

## 2021-03-06 ENCOUNTER — Ambulatory Visit: Payer: No Typology Code available for payment source

## 2021-03-06 ENCOUNTER — Other Ambulatory Visit: Payer: Self-pay | Admitting: Obstetrics and Gynecology

## 2021-03-06 ENCOUNTER — Encounter: Payer: Self-pay | Admitting: Hematology

## 2021-03-06 DIAGNOSIS — R928 Other abnormal and inconclusive findings on diagnostic imaging of breast: Secondary | ICD-10-CM

## 2021-03-10 ENCOUNTER — Other Ambulatory Visit: Payer: Self-pay

## 2021-03-18 ENCOUNTER — Other Ambulatory Visit: Payer: Self-pay

## 2021-03-18 DIAGNOSIS — D500 Iron deficiency anemia secondary to blood loss (chronic): Secondary | ICD-10-CM

## 2021-03-19 ENCOUNTER — Other Ambulatory Visit: Payer: Self-pay

## 2021-03-19 ENCOUNTER — Encounter: Payer: Self-pay | Admitting: Hematology

## 2021-03-19 ENCOUNTER — Inpatient Hospital Stay: Payer: Self-pay | Attending: Hematology

## 2021-03-19 ENCOUNTER — Inpatient Hospital Stay (HOSPITAL_BASED_OUTPATIENT_CLINIC_OR_DEPARTMENT_OTHER): Payer: Self-pay | Admitting: Hematology

## 2021-03-19 VITALS — BP 121/77 | HR 76 | Temp 98.2°F | Resp 18 | Ht 60.0 in | Wt 151.7 lb

## 2021-03-19 DIAGNOSIS — Z79899 Other long term (current) drug therapy: Secondary | ICD-10-CM | POA: Insufficient documentation

## 2021-03-19 DIAGNOSIS — D500 Iron deficiency anemia secondary to blood loss (chronic): Secondary | ICD-10-CM | POA: Insufficient documentation

## 2021-03-19 DIAGNOSIS — N920 Excessive and frequent menstruation with regular cycle: Secondary | ICD-10-CM | POA: Insufficient documentation

## 2021-03-19 DIAGNOSIS — Z7984 Long term (current) use of oral hypoglycemic drugs: Secondary | ICD-10-CM | POA: Insufficient documentation

## 2021-03-19 LAB — CBC WITH DIFFERENTIAL (CANCER CENTER ONLY)
Abs Immature Granulocytes: 0.02 10*3/uL (ref 0.00–0.07)
Basophils Absolute: 0 10*3/uL (ref 0.0–0.1)
Basophils Relative: 0 %
Eosinophils Absolute: 0.1 10*3/uL (ref 0.0–0.5)
Eosinophils Relative: 2 %
HCT: 37.3 % (ref 36.0–46.0)
Hemoglobin: 11.9 g/dL — ABNORMAL LOW (ref 12.0–15.0)
Immature Granulocytes: 0 %
Lymphocytes Relative: 23 %
Lymphs Abs: 1.7 10*3/uL (ref 0.7–4.0)
MCH: 28 pg (ref 26.0–34.0)
MCHC: 31.9 g/dL (ref 30.0–36.0)
MCV: 87.8 fL (ref 80.0–100.0)
Monocytes Absolute: 0.6 10*3/uL (ref 0.1–1.0)
Monocytes Relative: 8 %
Neutro Abs: 5 10*3/uL (ref 1.7–7.7)
Neutrophils Relative %: 67 %
Platelet Count: 336 10*3/uL (ref 150–400)
RBC: 4.25 MIL/uL (ref 3.87–5.11)
RDW: 17 % — ABNORMAL HIGH (ref 11.5–15.5)
WBC Count: 7.5 10*3/uL (ref 4.0–10.5)
nRBC: 0 % (ref 0.0–0.2)

## 2021-03-19 LAB — IRON AND IRON BINDING CAPACITY (CC-WL,HP ONLY)
Iron: 47 ug/dL (ref 28–170)
Saturation Ratios: 12 % (ref 10.4–31.8)
TIBC: 410 ug/dL (ref 250–450)
UIBC: 363 ug/dL (ref 148–442)

## 2021-03-19 LAB — FERRITIN: Ferritin: 39 ng/mL (ref 11–307)

## 2021-03-19 NOTE — Progress Notes (Signed)
?[place]   ?Telephone:(336) [phone] [phone]   ?Clinic Follow up Note  ? ?Patient Care Team: ?Gildardo Pounds, NP as PCP - General (Nurse Practitioner) ? ?Date of Service:  03/19/2021 ? ?CHIEF COMPLAINT: f/u of anemia ? ?CURRENT THERAPY:  ?Continue ferrous sulfate 325 mg oral 3 times a day.  ?IV Feriheme as needed if ferritin<20, last given 01/17/20 ? ?ASSESSMENT & PLAN:  ?Amy Maldonado is a 46 y.o. female with  ? ?1. 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 She has only required 2 blood infusions in 2019  Last IV Feraheme 04/05/19. She has required IV Iron about 3 times a year. She responds to IV iron well. ?-Although her anemia from Menorrhagia is mostly controlled, Hg will periodically drop even on oral and IV Iron. I suspect she has component of Anemia of Chronic disease from her DM.  ?-She notes only mild fatigue from her anemia. She continues to have monthly periods but feels they are getting lighter. ?-She is clinically doing well. Labs reviewed today, Hg up to 11.9 today. Iron is 47, ferritin is pending.  ?-Continue Ferrous sulfate TID  ?-Monitor with lab every 3 months. F/u in 9 months  ?  ?2. Hypothyroidism ?-She's been on 25 mg Synthroid. Stable.  ?  ?3. Diabetes ?-She is currently on Metformin per her PCP.  ?  ?4. Cancer screening:  ?-last PAP was 11/27/20 and was negative ?-mammogram on 02/13/21 showed b/l asymmetries. Diagnostic MM and left [place] on 03/06/21 shows probably benign left asymmetry. She is scheduled for repeat left MM on 09/03/21. ?-she will be due to start screening colonoscopy soon. ?  ?  ?Plan ?-Continue oral iron TID ?-Lab every 3 months  ?-f/u in 9 months ? ? ?No problem-specific Assessment & Plan notes found for this encounter. ? ? ?INTERVAL HISTORY:  ?Amy Maldonado is here for a follow up of anemia. She was last seen by me  on 09/20/20. She presents to the clinic alone. ?She reports she has been feeling well, no new concerns. She reports she feels her periods are getting lighter lately. ?  ?All other systems were reviewed with the patient and are negative. ? ?MEDICAL HISTORY:  ?Past Medical History:  ?Diagnosis Date  ? Anemia   ? Diabetes mellitus without complication ([place])   ? Hypothyroidism 2011  ? ? ?SURGICAL HISTORY: ?Past Surgical History:  ?Procedure Laterality Date  ? CESAREAN SECTION    ? 3 c section  ? TUBAL LIGATION    ? ? ?I have reviewed the social history and family history with the patient and they are unchanged from previous note. ? ?ALLERGIES:  has No Known Allergies. ? ?MEDICATIONS:  ?Current Outpatient Medications  ?Medication Sig Dispense Refill  ? atorvastatin (LIPITOR) 20 MG tablet Take 1 tablet (20 mg total) by mouth daily. 90 tablet 3  ? Blood Glucose Monitoring Suppl (TRUE METRIX METER) w/Device KIT Use as instructed. 1 kit 0  ? ferrous sulfate 325 (65 FE) MG tablet Take 1 tablet (325 mg total) by mouth 3 (three) times daily with meals. 180 tablet 3  ? glimepiride (AMARYL) 4 MG tablet Take 1 tablet (4 mg total) by mouth daily before breakfast. (Patient not taking: Reported on 02/13/2021) 90 tablet 1  ? glucose blood (TRUE METRIX BLOOD GLUCOSE TEST) test strip Use as instructed. Monitor blood glucose levels  twice a day by fingerstick. 100 each 12  ? levothyroxine (SYNTHROID) 25 MCG tablet Take 1 tablet (25 mcg total) by mouth daily before breakfast. 90 tablet 1  ? lisinopril (ZESTRIL) 2.5 MG tablet Take 1 tablet (2.5 mg total) by mouth daily. 90 tablet 1  ? metFORMIN (GLUCOPHAGE) 500 MG tablet Take 2 tablets (1,000 mg total) by mouth 2 (two) times daily with a meal. 360 tablet 1  ? sitaGLIPtin (JANUVIA) 50 MG tablet Take 1 tablet (50 mg total) by mouth daily. 90 tablet 1  ? TRUEplus Lancets 28G MISC Use as instructed. Monitor blood glucose levels twice a day by fingerstick. 100 each 3  ? ?No current  facility-administered medications for this visit.  ? ? ?PHYSICAL EXAMINATION: ?ECOG PERFORMANCE STATUS: 0 - Asymptomatic ? ?Vitals:  ? 03/19/21 1115  ?BP: 121/77  ?Pulse: 76  ?Resp: 18  ?Temp: 98.2 ?F (36.8 ?C)  ?SpO2: 100%  ? ?Wt Readings from Last 3 Encounters:  ?03/19/21 151 lb 11.2 oz (68.8 kg)  ?02/13/21 150 lb 14.4 oz (68.4 kg)  ?11/27/20 153 lb 6 oz (69.6 kg)  ?  ? ?GENERAL:alert, no distress and comfortable ?SKIN: skin color normal, no rashes or significant lesions ?EYES: normal, Conjunctiva are pink and non-injected, sclera clear  ?NEURO: alert & oriented x 3 with fluent speech ? ?LABORATORY DATA:  ?I have reviewed the data as listed ?CBC Latest Ref Rng & Units 03/19/2021 12/18/2020 09/20/2020  ?WBC 4.0 - 10.5 K/uL 7.5 7.8 8.2  ?Hemoglobin 12.0 - 15.0 g/dL 11.9(L) 12.0 9.1(L)  ?Hematocrit 36.0 - 46.0 % 37.3 35.3(L) 31.2(L)  ?Platelets 150 - 400 K/uL 336 358 452(H)  ? ? ? ?CMP Latest Ref Rng & Units 10/16/2020 06/14/2019 03/09/2019  ?Glucose 70 - 99 mg/dL 254(H) 114(H) 108(H)  ?BUN 6 - 24 mg/dL 11 8 9   ?Creatinine 0.57 - 1.00 mg/dL 0.55(L) 0.53(L) 0.52(L)  ?Sodium 134 - 144 mmol/L 136 142 138  ?Potassium 3.5 - 5.2 mmol/L 4.6 4.2 4.3  ?Chloride 96 - 106 mmol/L 100 104 103  ?CO2 20 - 29 mmol/L 20 22 22   ?Calcium 8.7 - 10.2 mg/dL 9.1 9.4 9.6  ?Total Protein 6.0 - 8.5 g/dL 7.1 - -  ?Total Bilirubin 0.0 - 1.2 mg/dL <0.2 - -  ?Alkaline Phos 44 - 121 IU/L 100 - -  ?AST 0 - 40 IU/L 26 - -  ?ALT 0 - 32 IU/L 52(H) - -  ? ? ? ? ?RADIOGRAPHIC STUDIES: ?I have personally reviewed the radiological images as listed and agreed with the findings in the report. ?No results found.  ? ? ?No orders of the defined types were placed in this encounter. ? ?All questions were answered. The patient knows to call the clinic with any problems, questions or concerns. No barriers to learning was detected. ?The total time spent in the appointment was 25 minutes. ? ?  ? Truitt Merle, MD ?03/19/2021  ? ?I, Wilburn Mylar, am acting as scribe for Truitt Merle, MD.  ? ?I have reviewed the above documentation for accuracy and completeness, and I agree with the above. ?  ? ? ?

## 2021-06-18 ENCOUNTER — Inpatient Hospital Stay: Payer: No Typology Code available for payment source | Attending: Hematology

## 2021-07-07 ENCOUNTER — Other Ambulatory Visit: Payer: Self-pay

## 2021-09-03 ENCOUNTER — Ambulatory Visit
Admission: RE | Admit: 2021-09-03 | Discharge: 2021-09-03 | Disposition: A | Discharge status: Home / Self Care | Payer: No Typology Code available for payment source | Source: Ambulatory Visit | Attending: Obstetrics and Gynecology | Admitting: Obstetrics and Gynecology

## 2021-09-03 ENCOUNTER — Other Ambulatory Visit: Payer: Self-pay | Admitting: Obstetrics and Gynecology

## 2021-09-03 DIAGNOSIS — R928 Other abnormal and inconclusive findings on diagnostic imaging of breast: Secondary | ICD-10-CM

## 2021-09-03 DIAGNOSIS — N6320 Unspecified lump in the left breast, unspecified quadrant: Secondary | ICD-10-CM

## 2021-09-05 ENCOUNTER — Ambulatory Visit
Admission: RE | Admit: 2021-09-05 | Discharge: 2021-09-05 | Disposition: A | Discharge status: Home / Self Care | Payer: No Typology Code available for payment source | Source: Ambulatory Visit | Attending: Obstetrics and Gynecology | Admitting: Obstetrics and Gynecology

## 2021-09-05 DIAGNOSIS — N6320 Unspecified lump in the left breast, unspecified quadrant: Secondary | ICD-10-CM

## 2021-09-05 HISTORY — PX: BREAST BIOPSY: SHX20

## 2021-09-17 ENCOUNTER — Inpatient Hospital Stay: Payer: No Typology Code available for payment source | Attending: Hematology

## 2021-09-17 ENCOUNTER — Other Ambulatory Visit: Payer: Self-pay

## 2021-09-17 DIAGNOSIS — D500 Iron deficiency anemia secondary to blood loss (chronic): Secondary | ICD-10-CM | POA: Insufficient documentation

## 2021-09-17 DIAGNOSIS — N920 Excessive and frequent menstruation with regular cycle: Secondary | ICD-10-CM | POA: Insufficient documentation

## 2021-09-17 LAB — CBC WITH DIFFERENTIAL (CANCER CENTER ONLY)
Abs Immature Granulocytes: 0.04 10*3/uL (ref 0.00–0.07)
Basophils Absolute: 0 10*3/uL (ref 0.0–0.1)
Basophils Relative: 0 %
Eosinophils Absolute: 0.1 10*3/uL (ref 0.0–0.5)
Eosinophils Relative: 2 %
HCT: 31.1 % — ABNORMAL LOW (ref 36.0–46.0)
Hemoglobin: 9.6 g/dL — ABNORMAL LOW (ref 12.0–15.0)
Immature Granulocytes: 1 %
Lymphocytes Relative: 24 %
Lymphs Abs: 1.7 10*3/uL (ref 0.7–4.0)
MCH: 24.2 pg — ABNORMAL LOW (ref 26.0–34.0)
MCHC: 30.9 g/dL (ref 30.0–36.0)
MCV: 78.3 fL — ABNORMAL LOW (ref 80.0–100.0)
Monocytes Absolute: 0.6 10*3/uL (ref 0.1–1.0)
Monocytes Relative: 9 %
Neutro Abs: 4.6 10*3/uL (ref 1.7–7.7)
Neutrophils Relative %: 64 %
Platelet Count: 257 10*3/uL (ref 150–400)
RBC: 3.97 MIL/uL (ref 3.87–5.11)
RDW: 24.2 % — ABNORMAL HIGH (ref 11.5–15.5)
WBC Count: 7.1 10*3/uL (ref 4.0–10.5)
nRBC: 0.3 % — ABNORMAL HIGH (ref 0.0–0.2)

## 2021-09-17 LAB — FERRITIN: Ferritin: 49 ng/mL (ref 11–307)

## 2021-09-17 LAB — IRON AND IRON BINDING CAPACITY (CC-WL,HP ONLY)
Iron: 199 ug/dL — ABNORMAL HIGH (ref 28–170)
Saturation Ratios: 47 % — ABNORMAL HIGH (ref 10.4–31.8)
TIBC: 424 ug/dL (ref 250–450)
UIBC: 225 ug/dL (ref 148–442)

## 2021-09-30 ENCOUNTER — Other Ambulatory Visit: Payer: Self-pay

## 2021-10-08 ENCOUNTER — Other Ambulatory Visit: Payer: Self-pay

## 2021-10-08 ENCOUNTER — Inpatient Hospital Stay: Payer: No Typology Code available for payment source

## 2021-10-08 VITALS — BP 134/79 | HR 70 | Temp 97.9°F | Resp 17

## 2021-10-08 DIAGNOSIS — D500 Iron deficiency anemia secondary to blood loss (chronic): Secondary | ICD-10-CM

## 2021-10-08 MED ORDER — SODIUM CHLORIDE 0.9 % IV SOLN
510.0000 mg | Freq: Once | INTRAVENOUS | Status: AC
  Administered 2021-10-08: 510 mg via INTRAVENOUS
  Filled 2021-10-08: qty 510

## 2021-10-08 MED ORDER — SODIUM CHLORIDE 0.9 % IV SOLN: Freq: Once | INTRAVENOUS | Status: AC

## 2021-10-08 NOTE — Patient Instructions (Signed)
Ferumoxytol Injection Qu es este medicamento? El FERUMOXITOL trata los niveles bajos de hierro en el cuerpo (anemia por deficiencia de hierro). El hierro es un mineral que cumple una funcin importante en la produccin de glbulos rojos, que llevan el oxgeno de los pulmones al resto del cuerpo. Este medicamento puede ser utilizado para otros usos; si tiene alguna pregunta consulte con su proveedor de atencin mdica o con su farmacutico. MARCAS COMUNES: Feraheme Qu le debo informar a mi profesional de la salud antes de tomar este medicamento? Necesitan saber si usted presenta alguno de los [employer] o situaciones: Anemia no causada por niveles bajos de hierro Administrator, Civil Service de hierro en la sangre Tiene programada la realizacin de una resonancia magntica Una reaccin alrgica o inusual al hierro, a otros medicamentos, alimentos, colorantes o conservantes Si est embarazada o buscando quedar embarazada Si est amamantando a un beb Cmo debo Insurance account manager medicamento? Este medicamento se inyecta en una vena. Se administra en un hospital o en un entorno clnico. Hable con su equipo de atencin sobre el uso de este medicamento en nios. Puede requerir atencin especial. Sobredosis: Pngase en contacto inmediatamente con un centro toxicolgico o una sala de urgencia si usted cree que haya tomado demasiado medicamento. ATENCIN: [employer] es solo para usted. No comparta este medicamento con nadie. Qu sucede si me olvido de una dosis? Es importante no olvidar ninguna dosis. Llame a su equipo de atencin si no puede asistir a una cita. Qu puede interactuar con este medicamento? Otros productos con hierro Puede ser que esta lista no menciona todas las posibles interacciones. Informe a su profesional de [employer]	[place] de [employer] productos a base de hierbas, medicamentos de [place] o suplementos nutritivos que est tomando. Si usted fuma, consume bebidas alcohlicas o si  utiliza drogas ilegales, indqueselo tambin a su profesional de [employer]	[place]. Algunas sustancias pueden interactuar con su medicamento. A qu debo estar atento al usar [employer]? Visite peridicamente a su equipo de atencin. Si los sntomas no comienzan a mejorar o si empeoran, consulte con su equipo de atencin. Usted podra necesitar realizarse [employer] de sangre mientras est usando [place]. Es posible que deba seguir una dieta especial. Hable con su equipo de atencin. Los alimentos que contienen hierro incluyen: granos enteros/cereales, frutas secas, legumbres, o guisantes, verduras de hojas verdes y asaduras (hgado, rin). Qu efectos secundarios puedo tener al [employer] este medicamento? Efectos secundarios que debe informar a su equipo de atencin tan pronto como sea posible: Reacciones alrgicas: erupcin cutnea, comezn/picazn, urticaria, hinchazn de la cara, los labios, la lengua o la garganta Presin arterial baja: mareo, sensacin de desmayo o aturdimiento, visin borrosa Falta de aliento Efectos secundarios que generalmente no requieren atencin mdica (debe informarlos a su equipo de atencin si persisten o si son molestos): Enrojecimiento Dolor de Special educational needs teacher en las articulaciones Dolor muscular Audiological scientist, enrojecimiento o Actor de la inyeccin Puede ser que esta lista no menciona todos los posibles efectos secundarios. Comunquese a su mdico por asesoramiento mdico [employer]. Usted puede informar los efectos secundarios a la FDA por telfono al 1-800-FDA-1088. Dnde debo guardar mi medicina? Este medicamento se administra en hospitales o clnicas y no necesitar guardarlo en su domicilio. ATENCIN: Este folleto es un resumen. Puede ser que no cubra toda la posible informacin. Si usted tiene preguntas acerca de esta medicina, consulte con su mdico, su farmacutico o su profesional de Technical sales engineer.  2023 Elsevier/Gold  Standard (2020-08-02  00:00:00)  

## 2021-12-16 NOTE — Progress Notes (Deleted)
[place]   Telephone:([phone] [phone]   Clinic Follow up Note   Patient Care Team: Gildardo Pounds, NP as PCP - General (Nurse Practitioner)  Date of Service:  12/16/2021  CHIEF COMPLAINT: f/u of  anemia   CURRENT THERAPY:   Continue ferrous sulfate 325 mg oral 3 times a day.  IV Feriheme as needed if ferritin<20, last given 01/17/20  ASSESSMENT: *** Amy Maldonado is a 46 y.o. female with   No problem-specific Assessment & Plan notes found for this encounter.  ***   PLAN:   SUMMARY OF ONCOLOGIC HISTORY: Oncology History   No history exists.     INTERVAL HISTORY: *** Amy Maldonado is here for a follow up anemia   She was last seen by  me on 03/19/2021 She presents to the clinic     All other systems were reviewed with the patient and are negative.  MEDICAL HISTORY:  Past Medical History:  Diagnosis Date   Anemia    Diabetes mellitus without complication ([place])    Hypothyroidism 2011    SURGICAL HISTORY: Past Surgical History:  Procedure Laterality Date   CESAREAN SECTION     3 c section   TUBAL LIGATION      I have reviewed the social history and family history with the patient and they are unchanged from previous note.  ALLERGIES:  has No Known Allergies.  MEDICATIONS:  Current Outpatient Medications  Medication Sig Dispense Refill   atorvastatin (LIPITOR) 20 MG tablet Take 1 tablet (20 mg total) by mouth daily. 90 tablet 3   Blood Glucose Monitoring Suppl (TRUE METRIX METER) w/Device KIT Use as instructed. 1 kit 0   ferrous sulfate 325 (65 FE) MG tablet Take 1 tablet (325 mg total) by mouth 3 (three) times daily with meals. 180 tablet 3   glimepiride (AMARYL) 4 MG tablet Take 1 tablet (4 mg total) by mouth daily before breakfast. (Patient not taking: Reported on 02/13/2021) 90 tablet 1   glucose blood (TRUE METRIX BLOOD GLUCOSE TEST) test strip Use as instructed. Monitor blood glucose levels twice a day by  fingerstick. 100 each 12   levothyroxine (SYNTHROID) 25 MCG tablet Take 1 tablet (25 mcg total) by mouth daily before breakfast. 90 tablet 1   lisinopril (ZESTRIL) 2.5 MG tablet Take 1 tablet (2.5 mg total) by mouth daily. 90 tablet 1   metFORMIN (GLUCOPHAGE) 500 MG tablet Take 2 tablets (1,000 mg total) by mouth 2 (two) times daily with a meal. 360 tablet 1   sitaGLIPtin (JANUVIA) 50 MG tablet Take 1 tablet (50 mg total) by mouth daily. 90 tablet 1   TRUEplus Lancets 28G MISC Use as instructed. Monitor blood glucose levels twice a day by fingerstick. 100 each 3   No current facility-administered medications for this visit.    PHYSICAL EXAMINATION: ECOG PERFORMANCE STATUS: {CHL ONC ECOG PS:[id number]}  There were no vitals filed for this visit. Wt Readings from Last 3 Encounters:  03/19/21 151 lb 11.2 oz (68.8 kg)  02/13/21 150 lb 14.4 oz (68.4 kg)  11/27/20 153 lb 6 oz (69.6 kg)    {Only keep what was examined. If exam not performed, can use .CEXAM } GENERAL:alert, no distress and comfortable SKIN: skin color, texture, turgor are normal, no rashes or significant lesions EYES: normal, Conjunctiva are pink and non-injected, sclera clear {OROPHARYNX:no exudate, no erythema and lips, buccal mucosa, and tongue normal}  NECK: supple, thyroid normal size, non-tender, without nodularity LYMPH:  no palpable lymphadenopathy in the cervical, axillary {or inguinal} LUNGS: clear to auscultation and percussion with normal breathing effort HEART: regular rate & rhythm and no murmurs and no lower extremity edema ABDOMEN:abdomen soft, non-tender and normal bowel sounds Musculoskeletal:no cyanosis of digits and no clubbing  NEURO: alert & oriented x 3 with fluent speech, no focal motor/sensory deficits  LABORATORY DATA:  I have reviewed the data as listed    Latest Ref Rng & Units 09/17/2021   12:56 PM 03/19/2021   10:39 AM 12/18/2020   10:48 AM  CBC  WBC 4.0 - 10.5 K/uL 7.1  7.5  7.8   Hemoglobin  12.0 - 15.0 g/dL 9.6  11.9  12.0   Hematocrit 36.0 - 46.0 % 31.1  37.3  35.3   Platelets 150 - 400 K/uL 257  336  358         Latest Ref Rng & Units 10/16/2020    9:13 AM 06/14/2019    1:50 PM 03/09/2019    4:53 PM  CMP  Glucose 70 - 99 mg/dL 254  114  108   BUN 6 - 24 mg/dL _0 Creatinine 0.57 - 1.00 mg/dL 0.55  0.53  0.52   Sodium 134 - 144 mmol/L 136  142  138   Potassium 3.5 - 5.2 mmol/L 4.6  4.2  4.3   Chloride 96 - 106 mmol/L 100  104  103   CO2 20 - 29 mmol/L _1 Calcium 8.7 - 10.2 mg/dL 9.1  9.4  9.6   Total Protein 6.0 - 8.5 g/dL 7.1     Total Bilirubin 0.0 - 1.2 mg/dL <0.2     Alkaline Phos 44 - 121 IU/L 100     AST 0 - 40 IU/L 26     ALT 0 - 32 IU/L 52         RADIOGRAPHIC STUDIES: I have personally reviewed the radiological images as listed and agreed with the findings in the report. No results found.    No orders of the defined types were placed in this encounter.  All questions were answered. The patient knows to call the clinic with any problems, questions or concerns. No barriers to learning was detected. The total time spent in the appointment was {CHL ONC TIME VISIT - [id number]}.     Baldemar Friday, CMA 12/16/2021   I, Audry Riles, CMA, am acting as scribe for Truitt Merle, MD.   {Add scribe attestation statement}

## 2021-12-17 ENCOUNTER — Inpatient Hospital Stay: Payer: No Typology Code available for payment source | Admitting: Hematology

## 2021-12-17 ENCOUNTER — Inpatient Hospital Stay: Payer: No Typology Code available for payment source | Attending: Hematology

## 2021-12-17 NOTE — Assessment & Plan Note (Deleted)
secondary to menorrhagia, component of anemia of chronic disease  -She is currently on ferrous sulfate 325 mg oral TID and IV iron as needed. She has only required 2 blood infusions in 2019  Last IV Feraheme 09/2021. She has required IV Iron about 1-3 times a year. She responds to IV iron well. -Although her anemia from Menorrhagia is mostly controlled, Hg will periodically drop even on oral and IV Iron. I suspect she has component of Anemia of Chronic disease from her DM.  -She notes only mild fatigue from her anemia. She continues to have monthly periods but feels they are getting lighter. -Continue Ferrous sulfate TID  -Monitor with lab every 3 months. F/u in 9 months

## 2022-01-21 ENCOUNTER — Other Ambulatory Visit: Payer: Self-pay

## 2022-01-21 DIAGNOSIS — Z1231 Encounter for screening mammogram for malignant neoplasm of breast: Secondary | ICD-10-CM

## 2022-03-05 ENCOUNTER — Ambulatory Visit
Admission: RE | Admit: 2022-03-05 | Discharge: 2022-03-05 | Disposition: A | Discharge status: Home / Self Care | Payer: No Typology Code available for payment source | Source: Ambulatory Visit | Attending: Nurse Practitioner | Admitting: Nurse Practitioner

## 2022-03-05 ENCOUNTER — Ambulatory Visit: Payer: Self-pay | Admitting: Hematology and Oncology

## 2022-03-05 ENCOUNTER — Encounter: Payer: Self-pay | Admitting: Hematology

## 2022-03-05 VITALS — BP 121/72 | Wt 140.0 lb

## 2022-03-05 DIAGNOSIS — Z1231 Encounter for screening mammogram for malignant neoplasm of breast: Secondary | ICD-10-CM

## 2022-03-05 DIAGNOSIS — Z1211 Encounter for screening for malignant neoplasm of colon: Secondary | ICD-10-CM

## 2022-03-05 NOTE — Progress Notes (Signed)
[name] is a 47 y.o. female who presents to [facility] clinic today with no complaints.    Pap Smear: Pap not smear completed today. Last Pap smear was 2022 and was normal. Per patient has no history of an abnormal Pap smear. Last Pap smear result is available in Epic.   Physical exam: Breasts Breasts symmetrical. No skin abnormalities bilateral breasts. No nipple retraction bilateral breasts. No nipple discharge bilateral breasts. No lymphadenopathy. No lumps palpated bilateral breasts.   MM CLIP PLACEMENT LEFT  Result Date: 09/05/2021 CLINICAL DATA:  Evaluate biopsy clip EXAM: 3D DIAGNOSTIC LEFT MAMMOGRAM POST ULTRASOUND BIOPSY COMPARISON:  Previous exam(s). FINDINGS: 3D Mammographic images were obtained following ultrasound guided biopsy of a left breast mass. The biopsy marking clip is in expected position at the site of biopsy. IMPRESSION: Appropriate positioning of the ribbon shaped biopsy marking clip at the site of biopsy in the location of the left breast focal asymmetry/mass. Final Assessment: Post Procedure Mammograms for Marker Placement Electronically Signed   By: Dorise Bullion III M.D.   On: 09/05/2021 08:35  MM DIAG BREAST TOMO UNI LEFT  Result Date: 09/03/2021 CLINICAL DATA:  First six-month follow-up for probably benign asymmetry without sonographic correlate in the LEFT breast. EXAM: DIGITAL DIAGNOSTIC UNILATERAL LEFT MAMMOGRAM WITH TOMOSYNTHESIS; ULTRASOUND LEFT BREAST LIMITED TECHNIQUE: Left digital diagnostic mammography and breast tomosynthesis was performed.; Targeted ultrasound examination of the left breast was performed. COMPARISON:  Previous exam(s). ACR Breast Density Category c: The breast tissue is heterogeneously dense, which may obscure small masses. FINDINGS: Within the LOWER INNER QUADRANT of the LEFT breast, there is focal asymmetry, confirmed on spot compression views. There is no associated mass or distortion. On physical exam, I palpate no abnormality  in the LOWER INNER QUADRANT of the LEFT breast. Targeted ultrasound is performed, showing a focal hyperechoic heterogeneous non mass finding in the 8:30 o'clock location of the LEFT breast 2 centimeters from the nipple which measures approximately 0.8 x 0.7 x 0.4 centimeters. Internal hypoechoic spaces are present. There is no associated internal blood flow posterior acoustic shadowing. Evaluation of the LEFT axilla is negative for adenopathy. IMPRESSION: Indeterminate non mass finding in the 8:30 o'clock location of the LEFT breast, a likely a sonographic correlate for mammographic abnormality. No LEFT axillary adenopathy. RECOMMENDATION: Recommend ultrasound-guided core biopsy of the non mass finding in the 8:30 o'clock location of the LEFT breast. Recommend close attention to post biopsy clip images to correlate mammographic and sonographic abnormalities. I have discussed the findings and recommendations with the patient with the assistance of an interpreter. If applicable, a reminder letter will be sent to the patient regarding the next appointment. BI-RADS CATEGORY  4: Suspicious. Electronically Signed   By: Nolon Nations M.D.   On: 09/03/2021 12:18  MS DIGITAL DIAG TOMO BILAT  Result Date: 03/06/2021 CLINICAL DATA:  The patient was called back for bilateral asymmetries. EXAM: DIGITAL DIAGNOSTIC BILATERAL MAMMOGRAM WITH TOMOSYNTHESIS AND CAD; ULTRASOUND LEFT BREAST LIMITED TECHNIQUE: Bilateral digital diagnostic mammography and breast tomosynthesis was performed. The images were evaluated with computer-aided detection.; Targeted ultrasound examination of the left breast was performed. COMPARISON:  Previous exam(s). ACR Breast Density Category c: The breast tissue is heterogeneously dense, which may obscure small masses. FINDINGS: The asymmetry in the inferior medial left breast persists. The increased conspicuity of this asymmetry is likely positional and technical in nature. The other asymmetries  resolve. On physical exam, no suspicious lumps are identified. Targeted ultrasound is performed, showing no sonographic correlate  for the left breast asymmetry. IMPRESSION: Probably benign medial inferior left breast asymmetry. RECOMMENDATION: Six-month follow-up mammography of the probably benign left breast asymmetry. I have discussed the findings and recommendations with the patient. If applicable, a reminder letter will be sent to the patient regarding the next appointment. BI-RADS CATEGORY  3: Probably benign. Electronically Signed   By: Dorise Bullion III M.D.   On: 03/06/2021 11:56  MS DIGITAL SCREENING TOMO BILATERAL  Result Date: 02/14/2021 CLINICAL DATA:  Screening. EXAM: DIGITAL SCREENING BILATERAL MAMMOGRAM WITH TOMOSYNTHESIS AND CAD TECHNIQUE: Bilateral screening digital craniocaudal and mediolateral oblique mammograms were obtained. Bilateral screening digital breast tomosynthesis was performed. The images were evaluated with computer-aided detection. COMPARISON:  Previous exam(s). ACR Breast Density Category c: The breast tissue is heterogeneously dense, which may obscure small masses. FINDINGS: In the right breast an asymmetry requires further evaluation. In the left breast asymmetries require further evaluation. IMPRESSION: Further evaluation is suggested for possible asymmetry in the right breast. Further evaluation is suggested for possible asymmetries in the left breast. RECOMMENDATION: Diagnostic mammogram and possibly ultrasound of both breasts. (Code:[id number]-74M) The patient will be contacted regarding the findings, and additional imaging will be scheduled. BI-RADS CATEGORY  0: Incomplete. Need additional imaging evaluation and/or prior mammograms for comparison. Electronically Signed   By: Dorise Bullion III M.D.   On: 02/14/2021 10:35   MS DIGITAL SCREENING TOMO BILATERAL  Result Date: 10/04/2019 CLINICAL DATA:  Screening. EXAM: DIGITAL SCREENING BILATERAL MAMMOGRAM WITH TOMO AND CAD  COMPARISON:  Previous exam(s). ACR Breast Density Category c: The breast tissue is heterogeneously dense, which may obscure small masses. FINDINGS: There are no findings suspicious for malignancy. Images were processed with CAD. IMPRESSION: No mammographic evidence of malignancy. A result letter of this screening mammogram will be mailed directly to the patient. RECOMMENDATION: Screening mammogram in one year. (Code:[id number]-01Y) BI-RADS CATEGORY  1: Negative. Electronically Signed   By: Nolon Nations M.D.   On: 10/04/2019 08:28   MM DIAG BREAST TOMO UNI RIGHT  Result Date: 12/20/2017 CLINICAL DATA:  Screening recall for a right breast asymmetry. EXAM: DIGITAL DIAGNOSTIC RIGHT MAMMOGRAM WITH CAD AND TOMO ULTRASOUND RIGHT BREAST COMPARISON:  Baseline screening mammogram 12/07/2017 ACR Breast Density Category c: The breast tissue is heterogeneously dense, which may obscure small masses. FINDINGS: There is no persistent asymmetry seen on the spot compression tomosynthesis images of the lateral right breast. Mammographic images were processed with CAD. On physical exam, no suspicious palpable masses are identified in the lateral aspect of the right breast. Ultrasound of the lateral and upper outer aspect of the right breast demonstrates normal fibroglandular tissue. No masses or suspicious areas of shadowing are identified. IMPRESSION: There are no persistent mammographic or targeted sonographic abnormalities in the lateral right breast. RECOMMENDATION: Screening mammogram in one year.(Code:[id number]-01Y) I have discussed the findings and recommendations with the patient. Results were also provided in writing at the conclusion of the visit. If applicable, a reminder letter will be sent to the patient regarding the next appointment. BI-RADS CATEGORY  1: Negative. Electronically Signed   By: Ammie Ferrier M.D.   On: 12/20/2017 16:19   MS DIGITAL SCREENING TOMO BILATERAL  Result Date: 12/08/2017 CLINICAL DATA:   Screening. EXAM: DIGITAL SCREENING BILATERAL MAMMOGRAM WITH TOMO AND CAD COMPARISON:  None. ACR Breast Density Category c: The breast tissue is heterogeneously dense, which may obscure small masses. FINDINGS: In the right breast, a possible asymmetry warrants further evaluation. This possible asymmetry is seen within the upper RIGHT breast,  at far posterior depth, MLO view only. In the left breast, no findings suspicious for malignancy. Images were processed with CAD. IMPRESSION: Further evaluation is suggested for possible asymmetry in the right breast. RECOMMENDATION: Diagnostic mammogram and possibly ultrasound of the right breast. (Code:[id number]) The patient will be contacted regarding the findings, and additional imaging will be scheduled. BI-RADS CATEGORY  0: Incomplete. Need additional imaging evaluation and/or prior mammograms for comparison. Electronically Signed   By: Franki Cabot M.D.   On: 12/08/2017 15:30        Pelvic/Bimanual Pap is not indicated today    Smoking History: Patient has never smoked and was not referred to quit line.    Patient Navigation: Patient education provided. Access to services provided for patient through [place] interpreter provided. No transportation provided   Colorectal Cancer Screening: Per patient has never had colonoscopy completed No complaints today.    Breast and Cervical Cancer Risk Assessment: Patient does not have family history of breast cancer, known genetic mutations, or radiation treatment to the chest before age 51. Patient does not have history of cervical dysplasia, immunocompromised, or DES exposure in-utero.  Risk Scores as of 03/05/2022     Amy Maldonado           5-year 0.76 %   Lifetime 8.52 %   This patient is [place]/Lat[place] but has no documented birth country, so the [facility] used data from [place] patients to calculate their risk score. Document a birth country in the Demographics activity for a more accurate  score.         Last calculated by Claretha Cooper, CMA on 03/05/2022 at 10:13 AM        A: [facility] exam without pap smear No complaints with benign exam.   P: Referred patient to the [facility] for a screening mammogram. Appointment scheduled 03/05/22.  Melodye Ped, NP 03/05/2022 10:28 AM

## 2022-03-05 NOTE — Patient Instructions (Signed)
[place] about self breast awareness and gave educational materials to take home. Patient did not need a Pap smear today due to last Pap smear was in 2022 per patient.  Let her know [facility] will cover Pap smears every 5 years unless has a history of abnormal Pap smears. Referred patient to the [facility] for diagnostic mammogram. Appointment scheduled for 03/05/22. Patient aware of appointment and will be there. Let patient know will follow up with her within the next couple weeks with results. [place] verbalized understanding.  Melodye Ped, NP 10:30 AM

## 2023-09-04 IMAGING — MG DIGITAL DIAGNOSTIC BILAT W/ TOMO W/ CAD
6 of 12 series · 6 of 36 positions shown · non-contrast
Comparison: Previous exam(s).

CLINICAL DATA: The patient was called back for bilateral
asymmetries.

EXAM:
DIGITAL DIAGNOSTIC BILATERAL MAMMOGRAM WITH TOMOSYNTHESIS AND CAD;
ULTRASOUND LEFT BREAST LIMITED
TECHNIQUE: Bilateral digital diagnostic mammography and breast tomosynthesis
was performed. The images were evaluated with computer-aided
detection.; Targeted ultrasound examination of the left breast was
performed.

[R ML synth-2D]
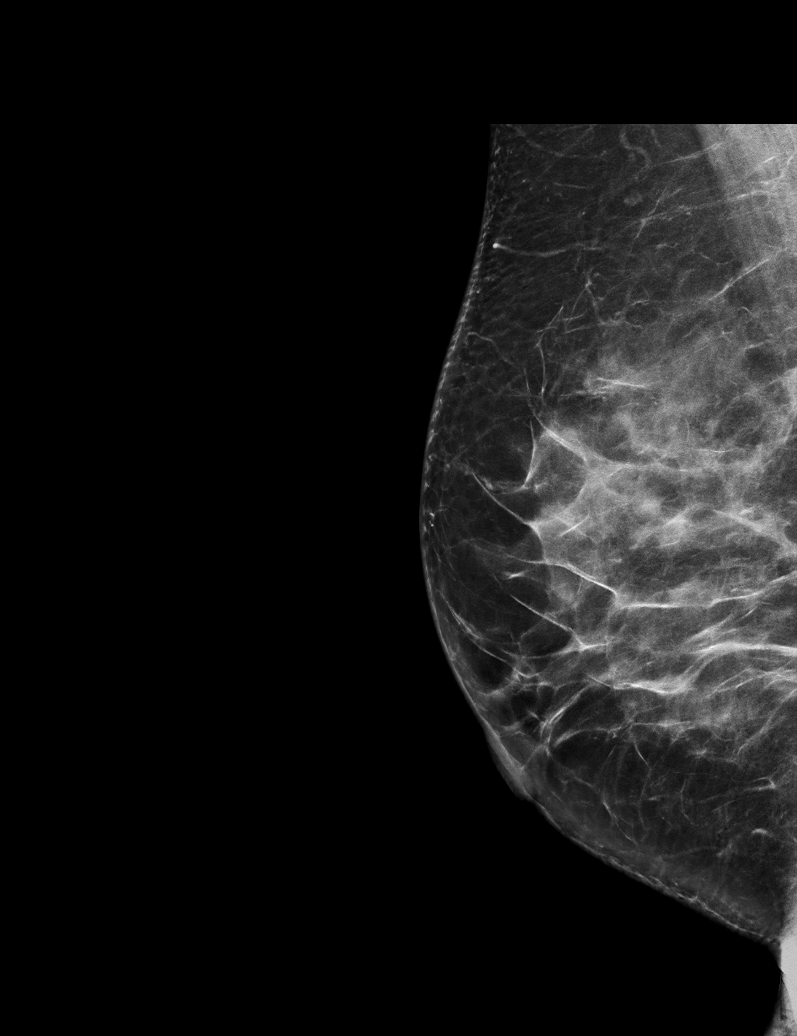

[L ML synth-2D]
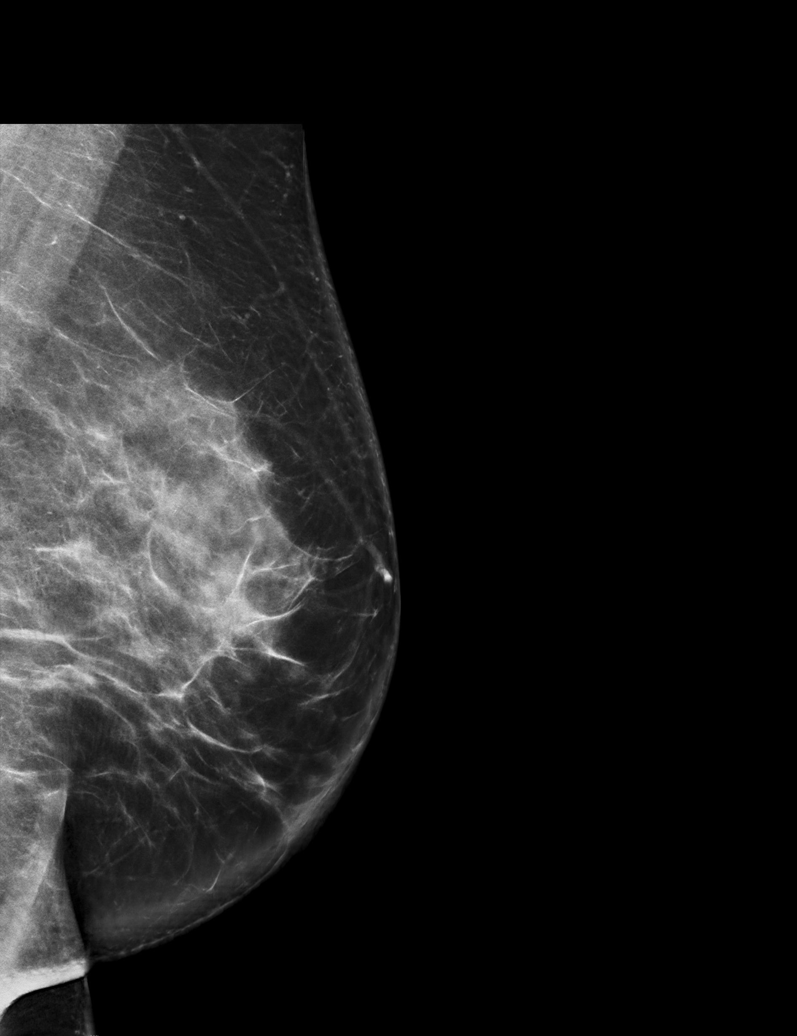

[L CC synth-2D]
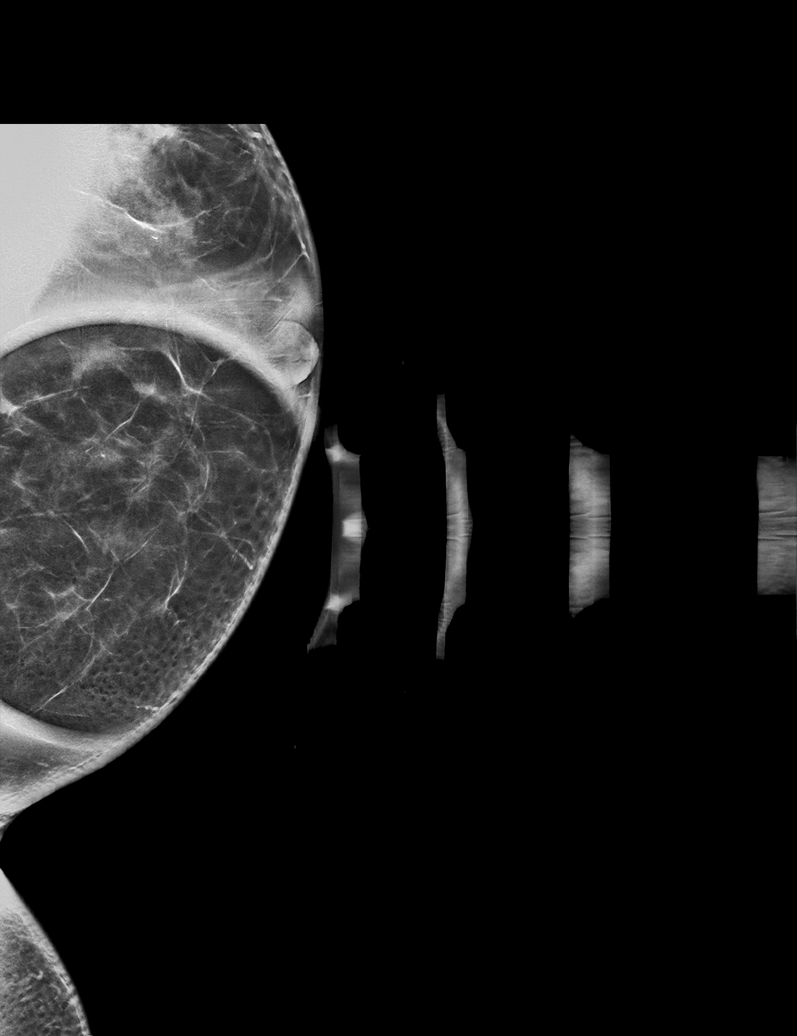

[L MLO synth-2D (1 of 2)]
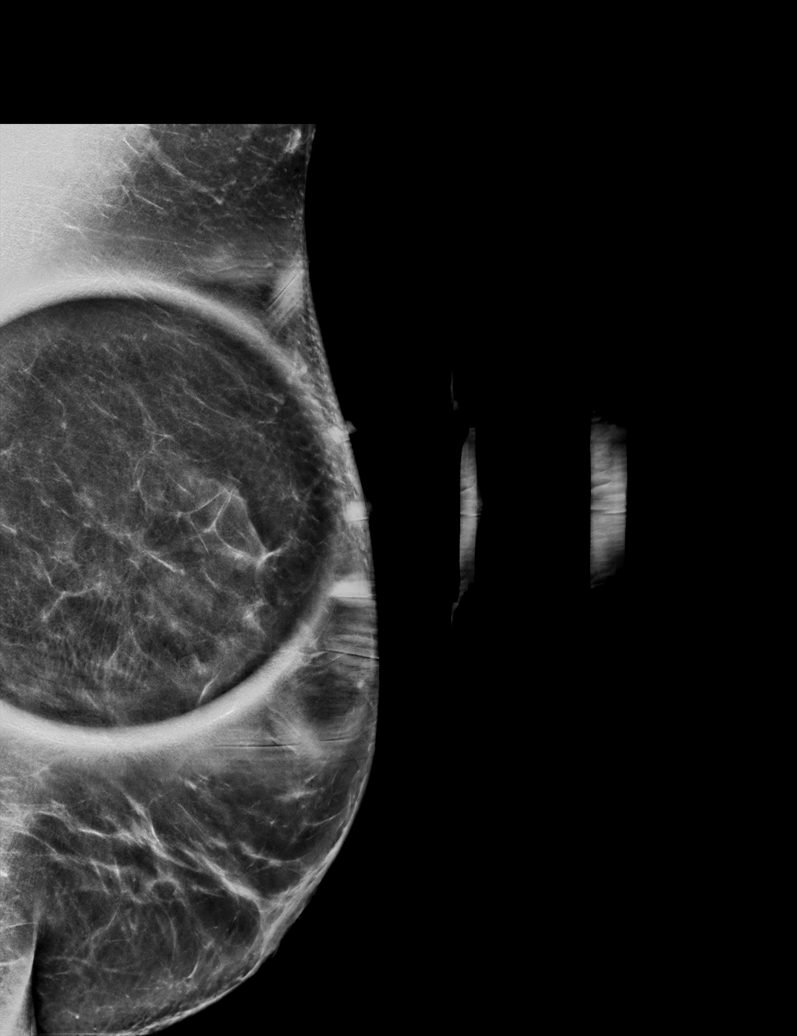

[L MLO synth-2D (2 of 2)]
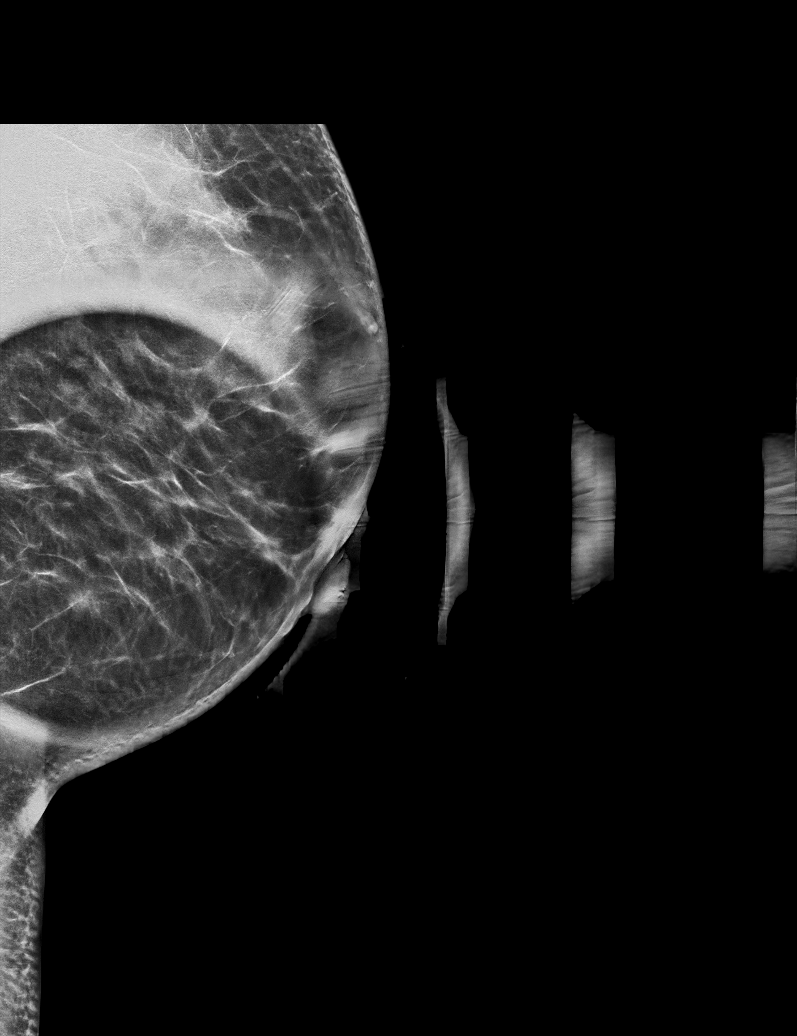

[R CC synth-2D]
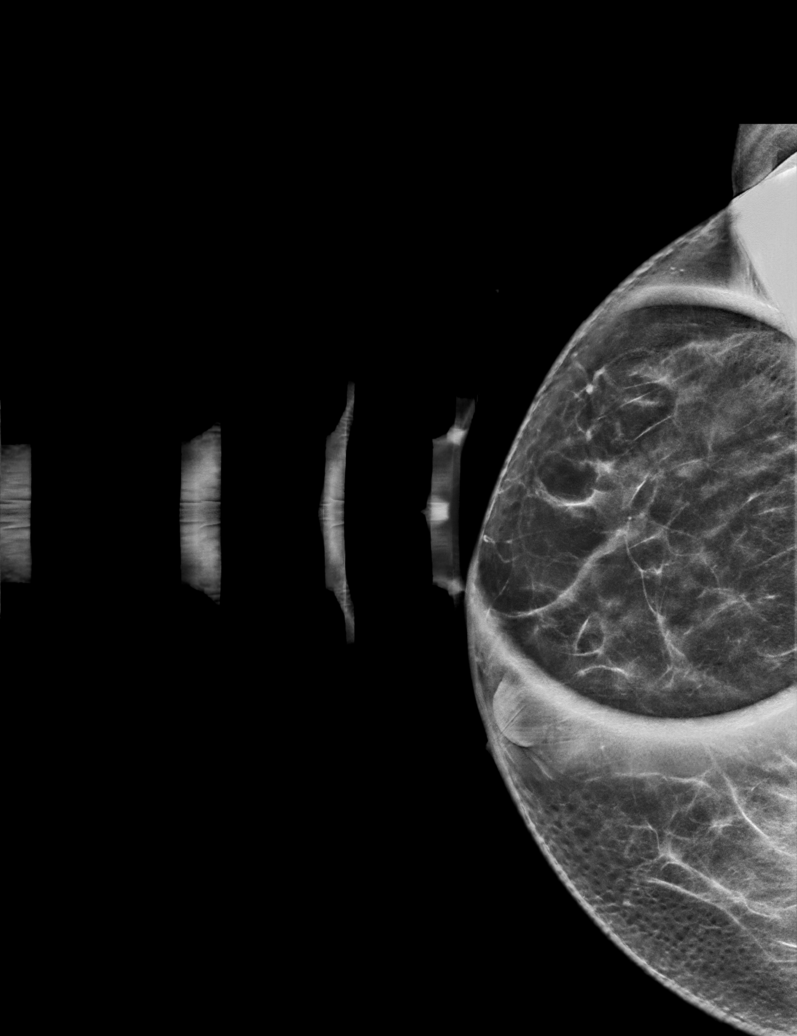

[6 of 36 positions shown; findings below may reference images not displayed]

ACR Breast Density Category c: The breast tissue is heterogeneously
dense, which may obscure small masses.
FINDINGS: The asymmetry in the inferior medial left breast persists. The
increased conspicuity of this asymmetry is likely positional and
technical in nature. The other asymmetries resolve.

On physical exam, no suspicious lumps are identified.

Targeted ultrasound is performed, showing no sonographic correlate
for the left breast asymmetry.
IMPRESSION: Probably benign medial inferior left breast asymmetry.

RECOMMENDATION:
Six-month follow-up mammography of the probably benign left breast
asymmetry.

I have discussed the findings and recommendations with the patient.
If applicable, a reminder letter will be sent to the patient
regarding the next appointment.

BI-RADS CATEGORY  3: Probably benign.
